# Patient Record
Sex: Female | Born: 1995 | Race: Black or African American | Hispanic: No | Marital: Single | Smoking: Former smoker
Health system: Southern US, Community
[De-identification: ages and names within clinical notes are randomized; demographics above are authoritative.]

## PROBLEM LIST (undated history)

## (undated) DIAGNOSIS — J45909 Unspecified asthma, uncomplicated: Secondary | ICD-10-CM

## (undated) HISTORY — PX: MOUTH SURGERY: SHX715

---
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
## 2014-11-17 ENCOUNTER — Emergency Department (HOSPITAL_COMMUNITY)
Admission: EM | Admit: 2014-11-17 | Discharge: 2014-11-17 | Disposition: A | Discharge status: Home / Self Care | Payer: Managed Care, Other (non HMO) | Attending: Emergency Medicine | Admitting: Emergency Medicine

## 2014-11-17 ENCOUNTER — Encounter (HOSPITAL_COMMUNITY): Payer: Self-pay | Admitting: Nurse Practitioner

## 2014-11-17 DIAGNOSIS — L500 Allergic urticaria: Secondary | ICD-10-CM | POA: Insufficient documentation

## 2014-11-17 DIAGNOSIS — Z87891 Personal history of nicotine dependence: Secondary | ICD-10-CM | POA: Insufficient documentation

## 2014-11-17 DIAGNOSIS — J45909 Unspecified asthma, uncomplicated: Secondary | ICD-10-CM | POA: Insufficient documentation

## 2014-11-17 DIAGNOSIS — Z79899 Other long term (current) drug therapy: Secondary | ICD-10-CM | POA: Diagnosis not present

## 2014-11-17 DIAGNOSIS — L509 Urticaria, unspecified: Secondary | ICD-10-CM

## 2014-11-17 DIAGNOSIS — R21 Rash and other nonspecific skin eruption: Secondary | ICD-10-CM | POA: Diagnosis present

## 2014-11-17 MED ORDER — PREDNISONE 20 MG PO TABS: ORAL_TABLET | ORAL | Status: DC

## 2014-11-17 MED ORDER — FAMOTIDINE 40 MG PO TABS: 40.0000 mg | ORAL_TABLET | Freq: Every day | ORAL | Status: DC

## 2014-11-17 MED ORDER — DIPHENHYDRAMINE HCL 25 MG PO TABS: 25.0000 mg | ORAL_TABLET | Freq: Four times a day (QID) | ORAL | Status: DC

## 2014-11-17 MED ORDER — FAMOTIDINE 20 MG PO TABS
40.0000 mg | ORAL_TABLET | Freq: Once | ORAL | Status: AC
  Administered 2014-11-17: 40 mg via ORAL
  Filled 2014-11-17: qty 2

## 2014-11-17 MED ORDER — DIPHENHYDRAMINE HCL 25 MG PO CAPS
50.0000 mg | ORAL_CAPSULE | Freq: Once | ORAL | Status: AC
  Administered 2014-11-17: 50 mg via ORAL
  Filled 2014-11-17: qty 2

## 2014-11-17 MED ORDER — DEXAMETHASONE SODIUM PHOSPHATE 10 MG/ML IJ SOLN
10.0000 mg | Freq: Once | INTRAMUSCULAR | Status: AC
  Administered 2014-11-17: 10 mg via INTRAMUSCULAR
  Filled 2014-11-17: qty 1

## 2014-11-17 NOTE — Discharge Instructions (Signed)
Take prednisone, benadryl and pepcid as prescribed.  Hives Hives are itchy, red, swollen areas of the skin. They can vary in size and location on your body. Hives can come and go for hours or several days (acute hives) or for several weeks (chronic hives). Hives do not spread from person to person (noncontagious). They may get worse with scratching, exercise, and emotional stress. CAUSES   Allergic reaction to food, additives, or drugs.  Infections, including the common cold.  Illness, such as vasculitis, lupus, or thyroid disease.  Exposure to sunlight, heat, or cold.  Exercise.  Stress.  Contact with chemicals. SYMPTOMS   Red or white swollen patches on the skin. The patches may change size, shape, and location quickly and repeatedly.  Itching.  Swelling of the hands, feet, and face. This may occur if hives develop deeper in the skin. DIAGNOSIS  Your caregiver can usually tell what is wrong by performing a physical exam. Skin or blood tests may also be done to determine the cause of your hives. In some cases, the cause cannot be determined. TREATMENT  Mild cases usually get better with medicines such as antihistamines. Severe cases may require an emergency epinephrine injection. If the cause of your hives is known, treatment includes avoiding that trigger.  HOME CARE INSTRUCTIONS   Avoid causes that trigger your hives.  Take antihistamines as directed by your caregiver to reduce the severity of your hives. Non-sedating or low-sedating antihistamines are usually recommended. Do not drive while taking an antihistamine.  Take any other medicines prescribed for itching as directed by your caregiver.  Wear loose-fitting clothing.  Keep all follow-up appointments as directed by your caregiver. SEEK MEDICAL CARE IF:   You have persistent or severe itching that is not relieved with medicine.  You have painful or swollen joints. SEEK IMMEDIATE MEDICAL CARE IF:   You have a  fever.  Your tongue or lips are swollen.  You have trouble breathing or swallowing.  You feel tightness in the throat or chest.  You have abdominal pain. These problems may be the first sign of a life-threatening allergic reaction. Call your local emergency services (911 in [place] MAKE SURE YOU:   Understand these instructions.  Will watch your condition.  Will get help right away if you are not doing well or get worse. Document Released: 08/06/2005 Document Revised: 08/11/2013 Document Reviewed: 10/30/2011 [facility] Patient Information 2015 [employer], [place]. This information is not intended to replace advice given to you by your health care provider. Make sure you discuss any questions you have with your health care provider.

## 2014-11-17 NOTE — ED Provider Notes (Signed)
CSN: [id number]     Arrival date & time 11/17/14  1900 History   This chart was scribed for non-physician practitioner Kathrynn Speed PA-C working with Gwyneth Sprout, MD by Conchita Paris, ED Scribe. This patient was seen in [facility] and the patient's care was started at 7:16 PM.   Chief Complaint  Patient presents with  . Allergic Reaction  . Rash   The history is provided by the patient. No language interpreter was used.    HPI Comments: Dana Travis is a 19 y.o. female who presents to the Emergency Department complaining of a generalized rash acute onset 15 min pta. The rash is located primarily on her back, but there are areas on her chest, abdomen and upper thighs. Pt states the rash is burning and itching. [place] her [place] came in contact with bushes. She has no known allergies. Pt denies SOB, and facial swelling.  Past Medical History  Diagnosis Date  . Asthma    Past Surgical History  Procedure Laterality Date  . Mouth surgery     History reviewed. No pertinent family history. History  Substance Use Topics  . Smoking status: Former Games developer  . Smokeless tobacco: Not on file  . Alcohol Use: Yes   OB History    No data available     Review of Systems  HENT: Negative for facial swelling.   Respiratory: Negative for shortness of breath.   Skin: Positive for rash.  All other systems reviewed and are negative.  Allergies  Alcohol-sulfur  Home Medications   Prior to Admission medications   Medication Sig Start Date End Date Taking? Authorizing Provider  buPROPion (WELLBUTRIN XL) 300 MG 24 hr tablet Take 300 mg by mouth daily.    Historical Provider, MD  diphenhydrAMINE (BENADRYL) 25 MG tablet Take 1 tablet (25 mg total) by mouth every 6 (six) hours. 11/17/14   Weston Kallman M Alley Neils, PA-C  Etonogestrel (NEXPLANON [place]) Inject into the skin.    Historical Provider, MD  famotidine (PEPCID) 40 MG tablet Take 1 tablet (40 mg total) by mouth daily. 11/17/14    Nicanor Mendolia M Kharee Lesesne, PA-C  ibuprofen (ADVIL,MOTRIN) 200 MG tablet Take 600 mg by mouth once.    Historical Provider, MD  Methylphenidate HCl (CONCERTA PO) Take 54 mg by mouth daily.    Historical Provider, MD  ondansetron (ZOFRAN ODT) 4 MG disintegrating tablet Take 1 tablet (4 mg total) by mouth every 8 (eight) hours as needed for nausea. 11/12/12   Garlon Hatchet, PA-C  predniSONE (DELTASONE) 20 MG tablet 2 tabs po daily x 4 days 11/17/14   Kathrynn Speed, PA-C  traZODone (DESYREL) 50 MG tablet Take 75 mg by mouth at bedtime.    Historical Provider, MD   BP 132/63 mmHg  Pulse 80  Temp(Src) 98.2 F (36.8 C) (Oral)  Resp 18  SpO2 100% Physical Exam  Constitutional: She is oriented to person, place, and time. She appears well-developed and well-nourished. No distress.  HENT:  Head: Normocephalic and atraumatic.  Mouth/Throat: Oropharynx is clear and moist.  No angioedema.  Eyes: Conjunctivae and EOM are normal.  Neck: Normal range of motion. Neck supple.  Cardiovascular: Normal rate, regular rhythm and normal heart sounds.   Pulmonary/Chest: Effort normal and breath sounds normal. No stridor. No respiratory distress.  Musculoskeletal: Normal range of motion. She exhibits no edema.  Neurological: She is alert and oriented to person, place, and time. No sensory deficit.  Skin: Skin  is warm and dry.  Diffuse urticaria on back, abdomen, inner thighs. Spares palms. No mucosal involvement.  Psychiatric: She has a normal mood and affect. Her behavior is normal.  Nursing note and vitals reviewed.   ED Course  Procedures  DIAGNOSTIC STUDIES: Oxygen Saturation is 100% on room air, normal by my interpretation.    COORDINATION OF CARE: 7:20 PM Discussed treatment plan with pt at bedside and pt agreed to plan. Will give decadron, pepcid and benadryl. No rest/airway compromise or involvement. No angioedema.  Labs Review Labs Reviewed - No data to display  Imaging Review No results found.   EKG  Interpretation None      MDM   Final diagnoses:  Hives    NAD. VSS. No respiratory or airway compromise. No angioedema. Decadron, pepcid and benadryl given in ED due to rapid spread of rash. Most likely from contact to woods. No further spread in ED. Stable for d/c. Rx prednisone, benadryl and pepcid. F/u with PCP. Return precautions given. Patient states understanding of treatment care plan and is agreeable.  I personally performed the services described in this documentation, which was scribed in my presence. The recorded information has been reviewed and is accurate.  Kathrynn SpeedRobyn M Liahna Brickner, PA-C 11/17/14 1958  Gwyneth SproutWhitney Plunkett, MD 11/18/14 [phone]

## 2014-11-17 NOTE — ED Notes (Signed)
Pt presents with generalized rash with more concentration on her back, states she went to the park and suspects pollen from a tree close by her packing may have triggered the reaction. C/o burning and itching sensation on areas with the rash. Denies any symptoms associated with anaphylaxis.

## 2015-10-29 ENCOUNTER — Encounter (HOSPITAL_COMMUNITY): Payer: Self-pay | Admitting: *Deleted

## 2015-10-29 ENCOUNTER — Emergency Department (HOSPITAL_COMMUNITY)
Admission: EM | Admit: 2015-10-29 | Discharge: 2015-10-30 | Disposition: A | Discharge status: Home / Self Care | Payer: Managed Care, Other (non HMO) | Attending: Emergency Medicine | Admitting: Emergency Medicine

## 2015-10-29 DIAGNOSIS — Z791 Long term (current) use of non-steroidal anti-inflammatories (NSAID): Secondary | ICD-10-CM | POA: Diagnosis not present

## 2015-10-29 DIAGNOSIS — J3489 Other specified disorders of nose and nasal sinuses: Secondary | ICD-10-CM | POA: Insufficient documentation

## 2015-10-29 DIAGNOSIS — R05 Cough: Secondary | ICD-10-CM | POA: Diagnosis not present

## 2015-10-29 DIAGNOSIS — Z7952 Long term (current) use of systemic steroids: Secondary | ICD-10-CM | POA: Insufficient documentation

## 2015-10-29 DIAGNOSIS — Z79899 Other long term (current) drug therapy: Secondary | ICD-10-CM | POA: Insufficient documentation

## 2015-10-29 DIAGNOSIS — J45909 Unspecified asthma, uncomplicated: Secondary | ICD-10-CM | POA: Diagnosis not present

## 2015-10-29 DIAGNOSIS — M791 Myalgia: Secondary | ICD-10-CM | POA: Insufficient documentation

## 2015-10-29 DIAGNOSIS — Z87891 Personal history of nicotine dependence: Secondary | ICD-10-CM | POA: Diagnosis not present

## 2015-10-29 DIAGNOSIS — R509 Fever, unspecified: Secondary | ICD-10-CM | POA: Insufficient documentation

## 2015-10-29 DIAGNOSIS — Z793 Long term (current) use of hormonal contraceptives: Secondary | ICD-10-CM | POA: Insufficient documentation

## 2015-10-29 DIAGNOSIS — R112 Nausea with vomiting, unspecified: Secondary | ICD-10-CM | POA: Diagnosis present

## 2015-10-29 LAB — URINALYSIS, ROUTINE W REFLEX MICROSCOPIC
Glucose, UA: NEGATIVE mg/dL
HGB URINE DIPSTICK: NEGATIVE
Ketones, ur: >80 mg/dL — AB
LEUKOCYTES UA: NEGATIVE
NITRITE: NEGATIVE
PROTEIN: 100 mg/dL — AB
SPECIFIC GRAVITY, URINE: 1.031 — AB (ref 1.005–1.030)
pH: 6 (ref 5.0–8.0)

## 2015-10-29 LAB — COMPREHENSIVE METABOLIC PANEL
ALK PHOS: 57 U/L (ref 38–126)
ALT: 18 U/L (ref 14–54)
ANION GAP: 13 (ref 5–15)
AST: 31 U/L (ref 15–41)
Albumin: 4.8 g/dL (ref 3.5–5.0)
BUN: 9 mg/dL (ref 6–20)
CALCIUM: 9.7 mg/dL (ref 8.9–10.3)
CHLORIDE: 100 mmol/L — AB (ref 101–111)
CO2: 22 mmol/L (ref 22–32)
CREATININE: 1.03 mg/dL — AB (ref 0.44–1.00)
Glucose, Bld: 93 mg/dL (ref 65–99)
Potassium: 3.7 mmol/L (ref 3.5–5.1)
SODIUM: 135 mmol/L (ref 135–145)
Total Bilirubin: 0.5 mg/dL (ref 0.3–1.2)
Total Protein: 8.3 g/dL — ABNORMAL HIGH (ref 6.5–8.1)

## 2015-10-29 LAB — CBC
HCT: 45.1 % (ref 36.0–46.0)
HEMOGLOBIN: 14.9 g/dL (ref 12.0–15.0)
MCH: 28.8 pg (ref 26.0–34.0)
MCHC: 33 g/dL (ref 30.0–36.0)
MCV: 87.2 fL (ref 78.0–100.0)
PLATELETS: 372 10*3/uL (ref 150–400)
RBC: 5.17 MIL/uL — AB (ref 3.87–5.11)
RDW: 13.3 % (ref 11.5–15.5)
WBC: 7.1 10*3/uL (ref 4.0–10.5)

## 2015-10-29 LAB — URINE MICROSCOPIC-ADD ON

## 2015-10-29 LAB — LIPASE, BLOOD: LIPASE: 16 U/L (ref 11–51)

## 2015-10-29 LAB — POC URINE PREG, ED: PREG TEST UR: NEGATIVE

## 2015-10-29 MED ORDER — ONDANSETRON 4 MG PO TBDP
4.0000 mg | ORAL_TABLET | Freq: Once | ORAL | Status: AC | PRN
  Administered 2015-10-29: 4 mg via ORAL
  Filled 2015-10-29: qty 1

## 2015-10-29 MED ORDER — ONDANSETRON HCL 4 MG/2ML IJ SOLN
4.0000 mg | Freq: Once | INTRAMUSCULAR | Status: AC
  Administered 2015-10-29: 4 mg via INTRAVENOUS
  Filled 2015-10-29: qty 2

## 2015-10-29 MED ORDER — METOCLOPRAMIDE HCL 5 MG/ML IJ SOLN
5.0000 mg | Freq: Once | INTRAMUSCULAR | Status: AC
  Administered 2015-10-29: 5 mg via INTRAVENOUS
  Filled 2015-10-29: qty 2

## 2015-10-29 MED ORDER — SODIUM CHLORIDE 0.9 % IV BOLUS (SEPSIS)
1000.0000 mL | Freq: Once | INTRAVENOUS | Status: AC
  Administered 2015-10-29: 1000 mL via INTRAVENOUS

## 2015-10-29 NOTE — ED Provider Notes (Signed)
CSN: [id number]     Arrival date & time 10/29/15  1551 History   First MD Initiated Contact with Patient 10/29/15 1601     Chief Complaint  Patient presents with  . Emesis  . Abdominal Pain      HPI 20 year old female presenting with flulike symptoms for the last 3 days. Patient reports that she was seen by her PCP 3 days ago and prescribed Tamiflu. After taking the Tamiflu, however, she began to have increased vomiting. Taking the medication. Symptoms include generalized myalgias, subjective fevers, clear rhinorrhea, dry cough, nausea, and vomiting. She denies chest pain or shortness of breath. No dysuria or abdominal pain. She reports that she has been unable to tolerate by mouth intake other than small amounts of fluid for the last 24 hours. Vomiting is nonbloody nonbilious. Diarrhea or constipation. She does endorse some small streaks of blood in her vomit after vomiting multiple times but denies any chest pain. Past Medical History  Diagnosis Date  . Asthma    Past Surgical History  Procedure Laterality Date  . Mouth surgery     History reviewed. No pertinent family history. Social History  Substance Use Topics  . Smoking status: Former Games developermoker  . Smokeless tobacco: None  . Alcohol Use: Yes   OB History    No data available     Review of Systems  Constitutional: Positive for fever and chills. Negative for activity change and appetite change.  HENT: Negative for congestion, rhinorrhea and sore throat.   Eyes: Negative for visual disturbance.  Respiratory: Positive for cough. Negative for shortness of breath and wheezing.   Cardiovascular: Negative for chest pain and palpitations.  Gastrointestinal: Positive for nausea and vomiting. Negative for abdominal pain, diarrhea, constipation, blood in stool and abdominal distention.  Genitourinary: Negative for dysuria, frequency and flank pain.  Musculoskeletal: Positive for myalgias. Negative for back pain, joint swelling,  arthralgias, gait problem, neck pain and neck stiffness.  Skin: Negative for rash.  Neurological: Negative for dizziness, tremors, syncope, facial asymmetry, speech difficulty, weakness, numbness and headaches.  Psychiatric/Behavioral: Negative for behavioral problems, confusion and agitation.      Allergies  Alcohol-sulfur  Home Medications   Prior to Admission medications   Medication Sig Start Date End Date Taking? Authorizing Provider  buPROPion (WELLBUTRIN XL) 300 MG 24 hr tablet Take 300 mg by mouth daily.    Historical Provider, MD  diphenhydrAMINE (BENADRYL) 25 MG tablet Take 1 tablet (25 mg total) by mouth every 6 (six) hours. 11/17/14   Robyn M Hess, PA-C  Etonogestrel (NEXPLANON [place]) Inject into the skin.    Historical Provider, MD  famotidine (PEPCID) 40 MG tablet Take 1 tablet (40 mg total) by mouth daily. 11/17/14   Robyn M Hess, PA-C  ibuprofen (ADVIL,MOTRIN) 200 MG tablet Take 600 mg by mouth once.    Historical Provider, MD  Methylphenidate HCl (CONCERTA PO) Take 54 mg by mouth daily.    Historical Provider, MD  ondansetron (ZOFRAN ODT) 4 MG disintegrating tablet Take 1 tablet (4 mg total) by mouth every 8 (eight) hours as needed for nausea. 11/12/12   Garlon HatchetLisa M Sanders, PA-C  predniSONE (DELTASONE) 20 MG tablet 2 tabs po daily x 4 days 11/17/14   Kathrynn Speedobyn M Hess, PA-C  traZODone (DESYREL) 50 MG tablet Take 75 mg by mouth at bedtime.    Historical Provider, MD   BP 118/94 mmHg  Pulse 81  Temp(Src) 98.8 F (37.1 C) (Oral)  Resp 16  Ht 5' 3.5" (  1.613 m)  Wt 79.918 kg  BMI 30.72 kg/m2  SpO2 100%  LMP 10/15/2015 Physical Exam  Constitutional: She is oriented to person, place, and time. She appears well-developed and well-nourished. No distress.  HENT:  Head: Normocephalic and atraumatic.  Right Ear: External ear normal.  Left Ear: External ear normal.  Nose: Nose normal.  Mouth/Throat: No oropharyngeal exudate.  Dry mucous membranes  Eyes: Conjunctivae and EOM are  normal. Pupils are equal, round, and reactive to light. Right eye exhibits no discharge. Left eye exhibits no discharge.  Neck: Normal range of motion. Neck supple.  Cardiovascular: Normal rate, regular rhythm and normal heart sounds.  Exam reveals no gallop and no friction rub.   No murmur heard. Pulmonary/Chest: Effort normal and breath sounds normal. No respiratory distress. She has no wheezes. She has no rales.  Abdominal: Soft. Bowel sounds are normal. She exhibits no distension and no mass. There is no tenderness. There is no rebound and no guarding.  Musculoskeletal: Normal range of motion. She exhibits no edema or tenderness.  Neurological: She is alert and oriented to person, place, and time. She exhibits normal muscle tone.  Skin: Skin is warm and dry. No rash noted. She is not diaphoretic.  Psychiatric: She has a normal mood and affect. Her behavior is normal. Judgment and thought content normal.    ED Course  Procedures (including critical care time) Labs Review Labs Reviewed  COMPREHENSIVE METABOLIC PANEL - Abnormal; Notable for the following:    Chloride 100 (*)    Creatinine, Ser 1.03 (*)    Total Protein 8.3 (*)    All other components within normal limits  CBC - Abnormal; Notable for the following:    RBC 5.17 (*)    All other components within normal limits  LIPASE, BLOOD  URINALYSIS, ROUTINE W REFLEX MICROSCOPIC (NOT AT [facility])  POC URINE PREG, ED    Imaging Review No results found. I have personally reviewed and evaluated these images and lab results as part of my medical decision-making.   EKG Interpretation None      MDM   Final diagnoses:  None    On arrival to the patient is afebrile and he medically stable. Mucous membranes dry and suspect element of dehydration. She endorses some small streaks of blood after vomiting forcefully however has no chest pain and no crepitus. No evidence of esophageal perforation. Lungs CTAP patient reports that her cough  is dry and I doubt pneumonia at this time. Patient's current constellation of symptoms is consistent with influenza or another viral-like process. No leukocytosis and I doubt a severe bacterial infection. Labs are significant for creatinine elevated to 1.03 which is likely in the setting of dehydration from recent vomiting. No evidence of a UTI patient denies dysuria or frequency. Patient will be given Zofran and IV fluids and then reassessed.  On reassessment patient reports symptomatically improvement of her nausea after Zofran, Reglan, and IV fluids. Plan to discharge him with small amount of Reglan as this seemed to help most with her nausea, and follow-up with her PCP early next week should symptoms continue. Additionally she was given strict precautions to return to the ED for inability to tolerate fluids by mouth.    Toua Stites Ernestina Penna, MD 10/30/15 0006  Lavera Guise, MD 10/30/15 [phone]

## 2015-10-29 NOTE — ED Notes (Signed)
Pt reports flu like SX's since WED. Pt also reports vomiting 8 times in past 24 HR with blood in fluid.

## 2015-10-30 ENCOUNTER — Emergency Department (HOSPITAL_COMMUNITY): Payer: Managed Care, Other (non HMO)

## 2015-10-30 ENCOUNTER — Emergency Department (HOSPITAL_COMMUNITY)
Admission: EM | Admit: 2015-10-30 | Discharge: 2015-10-30 | Disposition: A | Discharge status: Home / Self Care | Payer: Managed Care, Other (non HMO) | Source: Home / Self Care | Attending: Emergency Medicine | Admitting: Emergency Medicine

## 2015-10-30 ENCOUNTER — Encounter (HOSPITAL_COMMUNITY): Payer: Self-pay | Admitting: *Deleted

## 2015-10-30 DIAGNOSIS — F1210 Cannabis abuse, uncomplicated: Secondary | ICD-10-CM | POA: Insufficient documentation

## 2015-10-30 DIAGNOSIS — J45901 Unspecified asthma with (acute) exacerbation: Secondary | ICD-10-CM

## 2015-10-30 DIAGNOSIS — Z79899 Other long term (current) drug therapy: Secondary | ICD-10-CM | POA: Insufficient documentation

## 2015-10-30 DIAGNOSIS — Z87891 Personal history of nicotine dependence: Secondary | ICD-10-CM | POA: Insufficient documentation

## 2015-10-30 DIAGNOSIS — Z3202 Encounter for pregnancy test, result negative: Secondary | ICD-10-CM | POA: Insufficient documentation

## 2015-10-30 DIAGNOSIS — R05 Cough: Secondary | ICD-10-CM | POA: Insufficient documentation

## 2015-10-30 DIAGNOSIS — R112 Nausea with vomiting, unspecified: Secondary | ICD-10-CM | POA: Insufficient documentation

## 2015-10-30 LAB — URINALYSIS, ROUTINE W REFLEX MICROSCOPIC
Glucose, UA: NEGATIVE mg/dL
LEUKOCYTES UA: NEGATIVE
NITRITE: NEGATIVE
PH: 6 (ref 5.0–8.0)
Protein, ur: 30 mg/dL — AB
SPECIFIC GRAVITY, URINE: 1.034 — AB (ref 1.005–1.030)

## 2015-10-30 LAB — BASIC METABOLIC PANEL
ANION GAP: 9 (ref 5–15)
BUN: 14 mg/dL (ref 6–20)
CHLORIDE: 108 mmol/L (ref 101–111)
CO2: 24 mmol/L (ref 22–32)
Calcium: 8.8 mg/dL — ABNORMAL LOW (ref 8.9–10.3)
Creatinine, Ser: 0.83 mg/dL (ref 0.44–1.00)
GFR calc Af Amer: >60 mL/min (ref >60)
Glucose, Bld: 92 mg/dL (ref 65–99)
POTASSIUM: 3.7 mmol/L (ref 3.5–5.1)
Sodium: 141 mmol/L (ref 135–145)

## 2015-10-30 LAB — CBC WITH DIFFERENTIAL/PLATELET
BASOS ABS: 0 10*3/uL (ref 0.0–0.1)
Basophils Relative: 0 %
Eosinophils Absolute: 0 10*3/uL (ref 0.0–0.7)
Eosinophils Relative: 0 %
HEMATOCRIT: 40.6 % (ref 36.0–46.0)
HEMOGLOBIN: 14 g/dL (ref 12.0–15.0)
LYMPHS ABS: 1.6 10*3/uL (ref 0.7–4.0)
LYMPHS PCT: 13 %
MCH: 29.4 pg (ref 26.0–34.0)
MCHC: 34.5 g/dL (ref 30.0–36.0)
MCV: 85.3 fL (ref 78.0–100.0)
Monocytes Absolute: 0.8 10*3/uL (ref 0.1–1.0)
Monocytes Relative: 6 %
NEUTROS ABS: 10.6 10*3/uL — AB (ref 1.7–7.7)
Neutrophils Relative %: 81 %
Platelets: 342 10*3/uL (ref 150–400)
RBC: 4.76 MIL/uL (ref 3.87–5.11)
RDW: 13.4 % (ref 11.5–15.5)
WBC: 13 10*3/uL — AB (ref 4.0–10.5)

## 2015-10-30 LAB — URINE MICROSCOPIC-ADD ON: WBC, UA: NONE SEEN WBC/hpf (ref 0–5)

## 2015-10-30 LAB — RAPID URINE DRUG SCREEN, HOSP PERFORMED
AMPHETAMINES: NOT DETECTED
BARBITURATES: NOT DETECTED
BENZODIAZEPINES: NOT DETECTED
COCAINE: NOT DETECTED
Opiates: NOT DETECTED
TETRAHYDROCANNABINOL: POSITIVE — AB

## 2015-10-30 LAB — PREGNANCY, URINE: Preg Test, Ur: NEGATIVE

## 2015-10-30 MED ORDER — ONDANSETRON HCL 4 MG/2ML IJ SOLN
4.0000 mg | Freq: Once | INTRAMUSCULAR | Status: AC
  Administered 2015-10-30: 4 mg via INTRAVENOUS
  Filled 2015-10-30: qty 2

## 2015-10-30 MED ORDER — SODIUM CHLORIDE 0.9 % IV BOLUS (SEPSIS)
1000.0000 mL | Freq: Once | INTRAVENOUS | Status: AC
  Administered 2015-10-30: 1000 mL via INTRAVENOUS

## 2015-10-30 MED ORDER — PROMETHAZINE HCL 25 MG/ML IJ SOLN
25.0000 mg | Freq: Once | INTRAMUSCULAR | Status: AC
  Administered 2015-10-30: 25 mg via INTRAVENOUS
  Filled 2015-10-30: qty 1

## 2015-10-30 MED ORDER — METOCLOPRAMIDE HCL 10 MG PO TABS: 10.0000 mg | ORAL_TABLET | Freq: Three times a day (TID) | ORAL | Status: AC | PRN

## 2015-10-30 MED ORDER — PROMETHAZINE HCL 25 MG PO TABS: 25.0000 mg | ORAL_TABLET | Freq: Four times a day (QID) | ORAL | Status: DC | PRN

## 2015-10-30 NOTE — ED Provider Notes (Signed)
CSN: [id number]     Arrival date & time 10/30/15  1319 History   First MD Initiated Contact with Patient 10/30/15 1335     Chief Complaint  Patient presents with  . Emesis     (Consider location/radiation/quality/duration/timing/severity/associated sxs/prior Treatment) HPI Dana Travis is a 20 y.o. female with a history of asthma comes in for evaluation of nausea and vomiting. Patient reports she was seen at [facility] emergency Department yesterday and diagnosed with flulike illness. She reports ongoing nausea and vomiting with multiple episodes of nonbloody and nonbilious emesis since Thursday. She reports feeling better at the end of her emergency department visit yesterday, but when she woke up this morning, she began throwing up again. Discharged home with Reglan, that has not been effective. Also reports intermittent dry cough. Denies fevers, chest pain, shortness of breath, overt abdominal pain, illicit drug use (specifically marijuana), urinary symptoms.  Past Medical History  Diagnosis Date  . Asthma    Past Surgical History  Procedure Laterality Date  . Mouth surgery     No family history on file. Social History  Substance Use Topics  . Smoking status: Former Games developer  . Smokeless tobacco: None  . Alcohol Use: Yes   OB History    No data available     Review of Systems A 10 point review of systems was completed and was negative except for pertinent positives and negatives as mentioned in the history of present illness     Allergies  Alcohol-sulfur  Home Medications   Prior to Admission medications   Medication Sig Start Date End Date Taking? Authorizing Provider  albuterol (PROVENTIL HFA;VENTOLIN HFA) 108 (90 Base) MCG/ACT inhaler Inhale 1 puff into the lungs every 6 (six) hours as needed for wheezing or shortness of breath.   Yes Historical Provider, MD  amphetamine-dextroamphetamine (ADDERALL) 10 MG tablet Take 10 mg by mouth 2 (two) times daily as needed (for  attention disorder).  08/03/15  Yes Historical Provider, MD  Etonogestrel (NEXPLANON Redbird Smith) Inject into the skin.   Yes Historical Provider, MD  metoCLOPramide (REGLAN) 10 MG tablet Take 1 tablet (10 mg total) by mouth every 8 (eight) hours as needed for nausea or vomiting. 10/30/15  Yes Jenifer Ernestina Penna, MD  traZODone (DESYREL) 50 MG tablet Take 75 mg by mouth at bedtime.   Yes Historical Provider, MD  LORazepam (ATIVAN) 0.5 MG tablet Take 1 tablet by mouth 4 (four) times daily - after meals and at bedtime.  08/08/15   Historical Provider, MD  promethazine (PHENERGAN) 25 MG tablet Take 1 tablet (25 mg total) by mouth every 6 (six) hours as needed for nausea or vomiting. 10/30/15   Joycie Peek, PA-C   BP 118/90 mmHg  Pulse 79  Temp(Src) 99.2 F (37.3 C)  Resp 18  SpO2 95%  LMP 10/15/2015 Physical Exam  Constitutional: She is oriented to person, place, and time. She appears well-developed and well-nourished.  HENT:  Head: Normocephalic and atraumatic.  Mouth/Throat: Oropharynx is clear and moist.  Eyes: Conjunctivae are normal. Pupils are equal, round, and reactive to light. Right eye exhibits no discharge. Left eye exhibits no discharge. No scleral icterus.  Neck: Neck supple.  Cardiovascular: Normal rate, regular rhythm and normal heart sounds.   Pulmonary/Chest: Effort normal. No respiratory distress.  Mild, diffuse wheezing in all fields. Possible basilar rhonchi. Otherwise no other adventitious lung sounds. No respiratory distress. Maintains 99% oxygenation on room air.  Abdominal: Soft. There is no tenderness.  Musculoskeletal: She exhibits  no tenderness.  Neurological: She is alert and oriented to person, place, and time.  Cranial Nerves II-XII grossly intact  Skin: Skin is warm and dry. No rash noted.  Psychiatric: She has a normal mood and affect.  Nursing note and vitals reviewed.   ED Course  Procedures (including critical care time) Labs Review Labs Reviewed  BASIC  METABOLIC PANEL - Abnormal; Notable for the following:    Calcium 8.8 (*)    All other components within normal limits  CBC WITH DIFFERENTIAL/PLATELET - Abnormal; Notable for the following:    WBC 13.0 (*)    Neutro Abs 10.6 (*)    All other components within normal limits  URINALYSIS, ROUTINE W REFLEX MICROSCOPIC (NOT AT [facility]) - Abnormal; Notable for the following:    Color, Urine AMBER (*)    APPearance CLOUDY (*)    Specific Gravity, Urine 1.034 (*)    Hgb urine dipstick LARGE (*)    Bilirubin Urine SMALL (*)    Ketones, ur >80 (*)    Protein, ur 30 (*)    All other components within normal limits  URINE RAPID DRUG SCREEN, HOSP PERFORMED - Abnormal; Notable for the following:    Tetrahydrocannabinol POSITIVE (*)    All other components within normal limits  URINE MICROSCOPIC-ADD ON - Abnormal; Notable for the following:    Squamous Epithelial / LPF 0-5 (*)    Bacteria, UA FEW (*)    All other components within normal limits  PREGNANCY, URINE    Imaging Review Dg Chest 2 View  10/30/2015  CLINICAL DATA:  The tip fever and emesis.  Cough. EXAM: CHEST - 2 VIEW COMPARISON:  Two-view chest x-ray 11/12/2012. FINDINGS: The heart size and mediastinal contours are within normal limits. Both lungs are clear. The visualized skeletal structures are unremarkable. IMPRESSION: Negative two view chest x-ray Electronically Signed   By: Marin Robertshristopher  Mattern M.D.   On: 10/30/2015 14:45   I have personally reviewed and evaluated these images and lab results as part of my medical decision-making.   EKG Interpretation None      Meds given in ED:  Medications  sodium chloride 0.9 % bolus 1,000 mL (0 mLs Intravenous Stopped 10/30/15 1552)  ondansetron (ZOFRAN) injection 4 mg (4 mg Intravenous Given 10/30/15 1410)  sodium chloride 0.9 % bolus 1,000 mL (0 mLs Intravenous Stopped 10/30/15 1656)  promethazine (PHENERGAN) injection 25 mg (25 mg Intravenous Given 10/30/15 1548)    New Prescriptions    PROMETHAZINE (PHENERGAN) 25 MG TABLET    Take 1 tablet (25 mg total) by mouth every 6 (six) hours as needed for nausea or vomiting.   Filed Vitals:   10/30/15 1330 10/30/15 1553 10/30/15 1646  BP: 122/65 112/85 118/90  Pulse: 118 74 79  Temp: 99.2 F (37.3 C)    Resp: 20 18 18   SpO2: 99% 99% 95%    3:14pm--upon recheck, patient appears in no apparent distress, playing on phone. However, states still vomiting and unable to tolerate ginger ale. Will try Phenergan, 25 mg IV. MDM  Aggie HackerLeigh Rahming is a 20 y.o. female with recent diagnosis of flu comes in for evaluation of recurrent emesis. On arrival, hemodynamically stable, afebrile. Original tachycardia likely secondary to activity versus agitation as it quickly resolves with rest. Physical exam is unremarkable, moist mucous membranes, benign abdominal exam, no evidence of overt dehydration. Plan to obtain screening labs, urinalysis, urine rapid drug screen. Patient has one episode of emesis in the ED with Zofran. Screening labs  are unremarkable,  Pregnancy negative, urinalysis shows increased specific gravity 1.034, increased ketones, but no evidence of infection., Although patient specifically denies marijuana use, urine rapid drug screen is positive for THC. 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 Given Phenergan in the emergency department with resolution of emesis, she is tolerating oral fluids. Discharged with short course Phenergan and instructions to stay well-hydrated with aggressive oral rehydration. Follow up with PCP next week. She understands return to emergency department for any new, worsening or other concerning symptoms. Appropriate for discharge. Final diagnoses:  Non-intractable vomiting with nausea, vomiting of unspecified type        Joycie Peek, PA-C 11/01/15 0809  Laurence Spates, MD 11/01/15 2256

## 2015-10-30 NOTE — Discharge Instructions (Signed)

## 2015-10-30 NOTE — ED Notes (Signed)
Per EMS, pt from school.  [facility] seen [facility] yesterday for [facility].  Has been going since Thursday.  Pt has vomited x 5 today.  Pt states told yesterday if her script for reglan did not work, to come back.  IV: 22g Rt hand, 400 cc NS in route.  4 mg zofran IV.  No abdominal pain.  Vitals:  128/80, hr 90, resp 16, cbg 86

## 2015-10-30 NOTE — Discharge Instructions (Signed)
Your evaluation in the emergency department does not show any emergent causes for your symptoms at this time. Ear exam and labs are all reassuring. Please take your medications as prescribed. It is important to stay well hydrated and drink plenty of water, Gatorade or other electrolyte balance solution. Follow-up with your doctor next week for reevaluation. Return to ED for new or worsening symptoms as we discussed.  Nausea and Vomiting Nausea is a sick feeling that often comes before throwing up (vomiting). Vomiting is a reflex where stomach contents come out of your mouth. Vomiting can cause severe loss of body fluids (dehydration). Children and elderly adults can become dehydrated quickly, especially if they also have diarrhea. Nausea and vomiting are symptoms of a condition or disease. It is important to find the cause of your symptoms. CAUSES   Direct irritation of the stomach lining. This irritation can result from increased acid production (gastroesophageal reflux disease), infection, food poisoning, taking certain medicines (such as nonsteroidal anti-inflammatory drugs), alcohol use, or tobacco use.  Signals from the brain.These signals could be caused by a headache, heat exposure, an inner ear disturbance, increased pressure in the brain from injury, infection, a tumor, or a concussion, pain, emotional stimulus, or metabolic problems.  An obstruction in the gastrointestinal tract (bowel obstruction).  Illnesses such as diabetes, hepatitis, gallbladder problems, appendicitis, kidney problems, cancer, sepsis, atypical symptoms of a heart attack, or eating disorders.  Medical treatments such as chemotherapy and radiation.  Receiving medicine that makes you sleep (general anesthetic) during surgery. DIAGNOSIS Your caregiver may ask for tests to be done if the problems do not improve after a few days. Tests may also be done if symptoms are severe or if the reason for the nausea and vomiting is  not clear. Tests may include:  Urine tests.  Blood tests.  Stool tests.  Cultures (to look for evidence of infection).  X-rays or other imaging studies. Test results can help your caregiver make decisions about treatment or the need for additional tests. TREATMENT You need to stay well hydrated. Drink frequently but in small amounts.You may wish to drink water, sports drinks, clear broth, or eat frozen ice pops or gelatin dessert to help stay hydrated.When you eat, eating slowly may help prevent nausea.There are also some antinausea medicines that may help prevent nausea. HOME CARE INSTRUCTIONS   Take all medicine as directed by your caregiver.  If you do not have an appetite, do not force yourself to eat. However, you must continue to drink fluids.  If you have an appetite, eat a normal diet unless your caregiver tells you differently.  Eat a variety of complex carbohydrates (rice, wheat, potatoes, bread), lean meats, yogurt, fruits, and vegetables.  Avoid high-fat foods because they are more difficult to digest.  Drink enough water and fluids to keep your urine clear or pale yellow.  If you are dehydrated, ask your caregiver for specific rehydration instructions. Signs of dehydration may include:  Severe thirst.  Dry lips and mouth.  Dizziness.  Dark urine.  Decreasing urine frequency and amount.  Confusion.  Rapid breathing or pulse. SEEK IMMEDIATE MEDICAL CARE IF:   You have blood or brown flecks (like coffee grounds) in your vomit.  You have black or bloody stools.  You have a severe headache or stiff neck.  You are confused.  You have severe abdominal pain.  You have chest pain or trouble breathing.  You do not urinate at least once every 8 hours.  You develop  cold or clammy skin.  You continue to vomit for longer than 24 to 48 hours.  You have a fever. MAKE SURE YOU:   Understand these instructions.  Will watch your condition.  Will get  help right away if you are not doing well or get worse.   This information is not intended to replace advice given to you by your health care provider. Make sure you discuss any questions you have with your health care provider.   Document Released: 08/06/2005 Document Revised: 10/29/2011 Document Reviewed: 01/03/2011 Elsevier Interactive Patient Education [employer]

## 2015-10-30 NOTE — ED Notes (Signed)
Pt given ginger ale.

## 2015-10-30 NOTE — ED Notes (Signed)
Bed: WA03 Expected date:  Expected time:  Means of arrival:  Comments: EMS- 20 yo N/V, [facility]

## 2015-11-01 ENCOUNTER — Emergency Department (HOSPITAL_COMMUNITY): Payer: Managed Care, Other (non HMO)

## 2015-11-01 ENCOUNTER — Encounter (HOSPITAL_COMMUNITY): Payer: Self-pay

## 2015-11-01 ENCOUNTER — Inpatient Hospital Stay (HOSPITAL_COMMUNITY)
Admission: EM | Admit: 2015-11-01 | Discharge: 2015-11-04 | DRG: 202 | Disposition: A | Discharge status: Home / Self Care | Payer: Managed Care, Other (non HMO) | Attending: Internal Medicine | Admitting: Internal Medicine

## 2015-11-01 DIAGNOSIS — R740 Nonspecific elevation of levels of transaminase and lactic acid dehydrogenase [LDH]: Secondary | ICD-10-CM | POA: Diagnosis present

## 2015-11-01 DIAGNOSIS — J9601 Acute respiratory failure with hypoxia: Secondary | ICD-10-CM | POA: Diagnosis not present

## 2015-11-01 DIAGNOSIS — G43A0 Cyclical vomiting, not intractable: Secondary | ICD-10-CM | POA: Diagnosis present

## 2015-11-01 DIAGNOSIS — R109 Unspecified abdominal pain: Secondary | ICD-10-CM

## 2015-11-01 DIAGNOSIS — R1110 Vomiting, unspecified: Secondary | ICD-10-CM

## 2015-11-01 DIAGNOSIS — J45901 Unspecified asthma with (acute) exacerbation: Secondary | ICD-10-CM | POA: Diagnosis present

## 2015-11-01 DIAGNOSIS — E876 Hypokalemia: Secondary | ICD-10-CM | POA: Diagnosis present

## 2015-11-01 DIAGNOSIS — R112 Nausea with vomiting, unspecified: Secondary | ICD-10-CM | POA: Diagnosis present

## 2015-11-01 DIAGNOSIS — R197 Diarrhea, unspecified: Secondary | ICD-10-CM

## 2015-11-01 DIAGNOSIS — Z79899 Other long term (current) drug therapy: Secondary | ICD-10-CM

## 2015-11-01 DIAGNOSIS — A084 Viral intestinal infection, unspecified: Secondary | ICD-10-CM | POA: Diagnosis present

## 2015-11-01 DIAGNOSIS — R1011 Right upper quadrant pain: Secondary | ICD-10-CM | POA: Diagnosis not present

## 2015-11-01 DIAGNOSIS — J441 Chronic obstructive pulmonary disease with (acute) exacerbation: Secondary | ICD-10-CM | POA: Diagnosis present

## 2015-11-01 DIAGNOSIS — F1290 Cannabis use, unspecified, uncomplicated: Secondary | ICD-10-CM | POA: Diagnosis present

## 2015-11-01 DIAGNOSIS — R062 Wheezing: Secondary | ICD-10-CM

## 2015-11-01 DIAGNOSIS — T407X5A Adverse effect of cannabis (derivatives), initial encounter: Secondary | ICD-10-CM | POA: Diagnosis present

## 2015-11-01 DIAGNOSIS — R932 Abnormal findings on diagnostic imaging of liver and biliary tract: Secondary | ICD-10-CM | POA: Diagnosis not present

## 2015-11-01 DIAGNOSIS — Z87891 Personal history of nicotine dependence: Secondary | ICD-10-CM

## 2015-11-01 DIAGNOSIS — R7401 Elevation of levels of liver transaminase levels: Secondary | ICD-10-CM

## 2015-11-01 LAB — RAPID URINE DRUG SCREEN, HOSP PERFORMED
AMPHETAMINES: NOT DETECTED
BENZODIAZEPINES: NOT DETECTED
Barbiturates: NOT DETECTED
COCAINE: NOT DETECTED
Opiates: NOT DETECTED
Tetrahydrocannabinol: POSITIVE — AB

## 2015-11-01 LAB — COMPREHENSIVE METABOLIC PANEL
ALBUMIN: 4.2 g/dL (ref 3.5–5.0)
ALT: 32 U/L (ref 14–54)
ANION GAP: 13 (ref 5–15)
AST: 35 U/L (ref 15–41)
Alkaline Phosphatase: 49 U/L (ref 38–126)
BILIRUBIN TOTAL: 1.1 mg/dL (ref 0.3–1.2)
BUN: 7 mg/dL (ref 6–20)
CO2: 23 mmol/L (ref 22–32)
Calcium: 9.5 mg/dL (ref 8.9–10.3)
Chloride: 107 mmol/L (ref 101–111)
Creatinine, Ser: 0.69 mg/dL (ref 0.44–1.00)
GFR calc Af Amer: >60 mL/min (ref >60)
GFR calc non Af Amer: >60 mL/min (ref >60)
GLUCOSE: 91 mg/dL (ref 65–99)
POTASSIUM: 3.4 mmol/L — AB (ref 3.5–5.1)
SODIUM: 143 mmol/L (ref 135–145)
TOTAL PROTEIN: 7.4 g/dL (ref 6.5–8.1)

## 2015-11-01 LAB — CBC
HEMATOCRIT: 42.5 % (ref 36.0–46.0)
HEMOGLOBIN: 14 g/dL (ref 12.0–15.0)
MCH: 29.4 pg (ref 26.0–34.0)
MCHC: 32.9 g/dL (ref 30.0–36.0)
MCV: 89.1 fL (ref 78.0–100.0)
Platelets: 205 10*3/uL (ref 150–400)
RBC: 4.77 MIL/uL (ref 3.87–5.11)
RDW: 13.1 % (ref 11.5–15.5)
WBC: 9.1 10*3/uL (ref 4.0–10.5)

## 2015-11-01 LAB — URINALYSIS, ROUTINE W REFLEX MICROSCOPIC
BILIRUBIN URINE: NEGATIVE
GLUCOSE, UA: NEGATIVE mg/dL
HGB URINE DIPSTICK: NEGATIVE
Leukocytes, UA: NEGATIVE
NITRITE: NEGATIVE
PH: 7 (ref 5.0–8.0)
Protein, ur: NEGATIVE mg/dL
SPECIFIC GRAVITY, URINE: 1.008 (ref 1.005–1.030)

## 2015-11-01 LAB — I-STAT BETA HCG BLOOD, ED (MC, WL, AP ONLY)

## 2015-11-01 LAB — LIPASE, BLOOD: LIPASE: 20 U/L (ref 11–51)

## 2015-11-01 LAB — ETHANOL

## 2015-11-01 MED ORDER — ACETAMINOPHEN 325 MG PO TABS: 650.0000 mg | ORAL_TABLET | Freq: Four times a day (QID) | ORAL | Status: DC | PRN

## 2015-11-01 MED ORDER — ONDANSETRON HCL 4 MG PO TABS: 4.0000 mg | ORAL_TABLET | Freq: Four times a day (QID) | ORAL | Status: DC | PRN

## 2015-11-01 MED ORDER — SODIUM CHLORIDE 0.9 % IV SOLN
INTRAVENOUS | Status: AC
  Administered 2015-11-01: 19:00:00 via INTRAVENOUS

## 2015-11-01 MED ORDER — METHYLPREDNISOLONE SODIUM SUCC 125 MG IJ SOLR
125.0000 mg | Freq: Every day | INTRAMUSCULAR | Status: DC
  Administered 2015-11-01: 125 mg via INTRAVENOUS
  Filled 2015-11-01: qty 2

## 2015-11-01 MED ORDER — POTASSIUM CHLORIDE CRYS ER 20 MEQ PO TBCR
40.0000 meq | EXTENDED_RELEASE_TABLET | Freq: Once | ORAL | Status: AC
  Administered 2015-11-01: 40 meq via ORAL
  Filled 2015-11-01: qty 2

## 2015-11-01 MED ORDER — IPRATROPIUM-ALBUTEROL 0.5-2.5 (3) MG/3ML IN SOLN
3.0000 mL | Freq: Four times a day (QID) | RESPIRATORY_TRACT | Status: DC
  Administered 2015-11-01: 3 mL via RESPIRATORY_TRACT
  Filled 2015-11-01: qty 3

## 2015-11-01 MED ORDER — IPRATROPIUM BROMIDE 0.02 % IN SOLN: 0.5000 mg | Freq: Four times a day (QID) | RESPIRATORY_TRACT | Status: DC

## 2015-11-01 MED ORDER — ACETAMINOPHEN 650 MG RE SUPP: 650.0000 mg | Freq: Four times a day (QID) | RECTAL | Status: DC | PRN

## 2015-11-01 MED ORDER — IPRATROPIUM BROMIDE 0.02 % IN SOLN: 0.5000 mg | RESPIRATORY_TRACT | Status: DC | PRN

## 2015-11-01 MED ORDER — ONDANSETRON HCL 4 MG/2ML IJ SOLN
4.0000 mg | Freq: Once | INTRAMUSCULAR | Status: AC
  Administered 2015-11-01: 4 mg via INTRAVENOUS
  Filled 2015-11-01: qty 2

## 2015-11-01 MED ORDER — ALBUTEROL SULFATE (2.5 MG/3ML) 0.083% IN NEBU
5.0000 mg | INHALATION_SOLUTION | Freq: Once | RESPIRATORY_TRACT | Status: AC
  Administered 2015-11-01: 5 mg via RESPIRATORY_TRACT
  Filled 2015-11-01: qty 6

## 2015-11-01 MED ORDER — METOCLOPRAMIDE HCL 5 MG/ML IJ SOLN
10.0000 mg | Freq: Once | INTRAMUSCULAR | Status: AC
  Administered 2015-11-01: 10 mg via INTRAVENOUS
  Filled 2015-11-01: qty 2

## 2015-11-01 MED ORDER — TRAZODONE HCL 50 MG PO TABS
50.0000 mg | ORAL_TABLET | Freq: Every day | ORAL | Status: DC
  Administered 2015-11-01 – 2015-11-03 (×2): 50 mg via ORAL
  Filled 2015-11-01 (×2): qty 1

## 2015-11-01 MED ORDER — ONDANSETRON HCL 4 MG/2ML IJ SOLN
4.0000 mg | Freq: Four times a day (QID) | INTRAMUSCULAR | Status: DC | PRN
  Administered 2015-11-01 – 2015-11-04 (×5): 4 mg via INTRAVENOUS
  Filled 2015-11-01 (×5): qty 2

## 2015-11-01 MED ORDER — ALBUTEROL SULFATE (2.5 MG/3ML) 0.083% IN NEBU: 2.5000 mg | INHALATION_SOLUTION | Freq: Four times a day (QID) | RESPIRATORY_TRACT | Status: DC

## 2015-11-01 MED ORDER — ALBUTEROL (5 MG/ML) CONTINUOUS INHALATION SOLN
10.0000 mg/h | INHALATION_SOLUTION | RESPIRATORY_TRACT | Status: DC
  Administered 2015-11-01: 10 mg/h via RESPIRATORY_TRACT
  Filled 2015-11-01: qty 40

## 2015-11-01 MED ORDER — ALBUTEROL SULFATE (2.5 MG/3ML) 0.083% IN NEBU: 2.5000 mg | INHALATION_SOLUTION | RESPIRATORY_TRACT | Status: DC | PRN

## 2015-11-01 MED ORDER — METHYLPREDNISOLONE SODIUM SUCC 40 MG IJ SOLR
40.0000 mg | Freq: Two times a day (BID) | INTRAMUSCULAR | Status: DC
  Administered 2015-11-02 (×2): 40 mg via INTRAVENOUS
  Filled 2015-11-01 (×2): qty 1

## 2015-11-01 MED ORDER — HYOSCYAMINE SULFATE 0.125 MG SL SUBL
0.2500 mg | SUBLINGUAL_TABLET | Freq: Once | SUBLINGUAL | Status: AC
  Administered 2015-11-01: 0.25 mg via SUBLINGUAL
  Filled 2015-11-01: qty 2

## 2015-11-01 MED ORDER — SODIUM CHLORIDE 0.9 % IV BOLUS (SEPSIS)
2000.0000 mL | Freq: Once | INTRAVENOUS | Status: AC
  Administered 2015-11-01: 2000 mL via INTRAVENOUS

## 2015-11-01 MED ORDER — LORAZEPAM 0.5 MG PO TABS
0.5000 mg | ORAL_TABLET | Freq: Every day | ORAL | Status: DC
  Administered 2015-11-01: 0.5 mg via ORAL
  Filled 2015-11-01: qty 1

## 2015-11-01 MED ORDER — IPRATROPIUM-ALBUTEROL 0.5-2.5 (3) MG/3ML IN SOLN
3.0000 mL | Freq: Three times a day (TID) | RESPIRATORY_TRACT | Status: DC
  Administered 2015-11-02 – 2015-11-04 (×8): 3 mL via RESPIRATORY_TRACT
  Filled 2015-11-01 (×7): qty 3

## 2015-11-01 MED ORDER — DIPHENHYDRAMINE HCL 50 MG/ML IJ SOLN
25.0000 mg | Freq: Once | INTRAMUSCULAR | Status: AC
  Administered 2015-11-01: 25 mg via INTRAVENOUS
  Filled 2015-11-01: qty 1

## 2015-11-01 NOTE — ED Provider Notes (Signed)
CSN: [id number]     Arrival date & time 11/01/15  0800 History   First MD Initiated Contact with Patient 11/01/15 0902     Chief Complaint  Patient presents with  . Abdominal Pain  . Emesis  . Diarrhea  . Insomnia     (Consider location/radiation/quality/duration/timing/severity/associated sxs/prior Treatment) HPI Patient presents with 5 days of vomiting and diarrhea. She's been evaluated in the emergency department x 2. She has persistent vomiting. She's been unable to tolerate PO Reglan. She also has had persistent diarrhea. Patient states she had 6 episodes of vomiting this morning and 2 loose stools. She's had no fever or chills. She states she's also had wheezing and cough that at times is associated with vomiting. She complains of diffuse abdominal pain that is unchanged. She is currently on her menstrual cycle for the past 2 days. Denies leg pain or discharge Past Medical History  Diagnosis Date  . Asthma    Past Surgical History  Procedure Laterality Date  . Mouth surgery     Family History  Problem Relation Age of Onset  . Adopted: Yes  . Family history unknown: Yes   Social History  Substance Use Topics  . Smoking status: Former Games developer  . Smokeless tobacco: Never Used  . Alcohol Use: Yes     Comment: occasionally   OB History    No data available     Review of Systems  Constitutional: Negative for fever and chills.  Respiratory: Positive for cough and wheezing. Negative for shortness of breath.   Cardiovascular: Negative for chest pain.  Gastrointestinal: Positive for nausea, vomiting, abdominal pain and diarrhea. Negative for constipation and blood in stool.  Genitourinary: Positive for vaginal bleeding. Negative for dysuria, frequency, flank pain and pelvic pain.  Musculoskeletal: Negative for myalgias, back pain, neck pain and neck stiffness.  Skin: Negative for rash and wound.  Neurological: Positive for light-headedness. Negative for dizziness, weakness,  numbness and headaches.  All other systems reviewed and are negative.     Allergies  Alcohol-sulfur  Home Medications   Prior to Admission medications   Medication Sig Start Date End Date Taking? Authorizing Provider  albuterol (PROVENTIL HFA;VENTOLIN HFA) 108 (90 Base) MCG/ACT inhaler Inhale 1 puff into the lungs every 6 (six) hours as needed for wheezing or shortness of breath.   Yes Historical Provider, MD  amphetamine-dextroamphetamine (ADDERALL) 10 MG tablet Take 10 mg by mouth 2 (two) times daily as needed (for attention disorder).  08/03/15  Yes Historical Provider, MD  Etonogestrel (NEXPLANON Martin) Inject into the skin.   Yes Historical Provider, MD  LORazepam (ATIVAN) 0.5 MG tablet Take 0.5 mg by mouth at bedtime.  08/08/15  Yes Historical Provider, MD  metoCLOPramide (REGLAN) 10 MG tablet Take 1 tablet (10 mg total) by mouth every 8 (eight) hours as needed for nausea or vomiting. 10/30/15  Yes Jenifer Ernestina Penna, MD  promethazine (PHENERGAN) 25 MG tablet Take 1 tablet (25 mg total) by mouth every 6 (six) hours as needed for nausea or vomiting. 10/30/15  Yes Joycie Peek, PA-C  traZODone (DESYREL) 50 MG tablet Take 50 mg by mouth at bedtime.    Yes Historical Provider, MD   BP 106/62 mmHg  Pulse 47  Temp(Src) 98.3 F (36.8 C) (Oral)  Resp 14  SpO2 98%  LMP 11/01/2015 Physical Exam  Constitutional: She is oriented to person, place, and time. She appears well-developed and well-nourished. No distress.  HENT:  Head: Normocephalic and atraumatic.  Mouth/Throat: Oropharynx is  clear and moist. No oropharyngeal exudate.  Eyes: EOM are normal. Pupils are equal, round, and reactive to light.  Neck: Normal range of motion. Neck supple.  Cardiovascular: Normal rate and regular rhythm.   Pulmonary/Chest: Effort normal. No respiratory distress. She has wheezes (Expiratory wheezing throughout). She has no rales.  Abdominal: Soft. Bowel sounds are normal. She exhibits no distension  and no mass. There is tenderness (Mild diffuse tenderness without focality.). There is no rebound and no guarding.  Musculoskeletal: Normal range of motion. She exhibits no edema or tenderness.  No CVA tenderness bilaterally.  Neurological: She is alert and oriented to person, place, and time.  Awake and alert. Moves all extremities without deficit. Sensation is fully intact.  Skin: Skin is warm and dry. No rash noted. No erythema.  Psychiatric: She has a normal mood and affect. Her behavior is normal.  Nursing note and vitals reviewed.   ED Course  Procedures (including critical care time) Labs Review Labs Reviewed  COMPREHENSIVE METABOLIC PANEL - Abnormal; Notable for the following:    Potassium 3.4 (*)    All other components within normal limits  URINALYSIS, ROUTINE W REFLEX MICROSCOPIC (NOT AT [facility]) - Abnormal; Notable for the following:    Ketones, ur >80 (*)    All other components within normal limits  LIPASE, BLOOD  CBC  I-STAT BETA HCG BLOOD, ED ([facility], [facility], [facility] ONLY)    Imaging Review Dg Abd Acute W/chest  11/01/2015  CLINICAL DATA:  Intermittent diarrhea and vomiting over the past 5 days EXAM: DG ABDOMEN ACUTE W/ 1V CHEST COMPARISON:  10/30/2015 FINDINGS: There is no evidence of dilated bowel loops or free intraperitoneal air. No radiopaque calculi or other significant radiographic abnormality is seen. Heart size and mediastinal contours are within normal limits. Both lungs are clear. IMPRESSION: Negative abdominal radiographs.  No acute cardiopulmonary disease. Electronically Signed   By: Alcide CleverMark  Lukens M.D.   On: 11/01/2015 12:03   I have personally reviewed and evaluated these images and lab results as part of my medical decision-making.   EKG Interpretation None      MDM   Final diagnoses:  Vomiting and diarrhea  Wheezing    Patient asked me to call her uncle is a Transport plannerpediatric surgeon to discuss her symptoms. Discussed length. Patient's abdominal exam is completely  benign. She has a normal white blood cell count. She does have ongoing wheezing and was given albuterol treatment in emergency department. Also given IV fluids. She is in no distress and has had no vomiting in the emergency department.    Patient with persistent wheezing and shortness of breath despite continuous nebulized treatment and IV Solu-Medrol. She's had no further vomiting in the emergency department. Chest x-ray without evidence of pneumonia. Given persistent wheezing and third visit in the last few days will admit to observation. Discussed with Dr. Doneen Poissonevine  Kimaria Struthers, MD 11/01/15 [phone]

## 2015-11-01 NOTE — ED Notes (Addendum)
Patient reports that she has had intermittent diarrhea, vomiting, but states she has constant abdominal burning and pain x 5 days. Patient was seen in the ED 2 days ago for the same.

## 2015-11-01 NOTE — H&P (Signed)
[facility] History and Physical  Dana Travis WUJ:[id number] DOB: 03/10/1996 DOA: 11/01/2015  Referring physician: ER physician: Dr. Loren Racer  PCP: Pcp Not In System - will set up with [facility] on discharge   Chief Complaint: nausea and vomiting  HPI:  20 year old female, goes to school in this area but not from [place] area otherwise. She presented to ED 3/11 and 3/12 with essentially similar reports of nausea, vomiting, not feeling well. Both time she was given either Reglan or phenergan and sent home but this time she says cant go home because her symptoms are not improving. She reports cramp like abdominal discomfort which intermittently gets better after vomiting. No reports of fever. She did have episode of diarrhea but no blood in stool. No urinary complaints. Pt further reported having wheezing since this am. She has used inhaler every few hours at home without symptomatic relief.    In ED, BP was 106/62, temp 99.3 F, oxygen saturation was 93% with [place] oxygen support. Blood work showed mild leukocytosis which has resolved on subsequent labs. She had ketones on UA but no other abnormalities. CXR showed no active disease on 3/12 , abd x ray on this admission showed no acute findings.She was wheezing even with continuous nebulizer in ED. She was given solumedrol 125 mg IV once. [facility] asked to admit for further management of N/V and wheezing.   Assessment & Plan    Principal Problem:   Acute respiratory failure with hypoxia (HCC) /  Asthma, chronic obstructive, with acute exacerbation (HCC) - Started albuterol and Atrovent every 6 hours scheduled and every 2 hours PRN shortness of breath and/or wheezing - Started solumedrol 40 mg IV Q 12 hours - Oxygen support via [place] to keep O2 sats above 90%  Active Problems:   Nausea and vomiting / Leukocytosis  - Unclear etiology, possibly viral gastroenteritis - LFT's and lipase WNL, beta hCG negative. She has ketones in urine but normal  CBG, glucose, anion gap. Will check UDS and alcohol level - Diet as tolerated per her request - May continue IV fluids for hydration    Hypokalemia - Due to GI losses - Supplemented - Check BMP in am  DVT prophylaxis:  - SCD's bilaterally   Radiological Exams on Admission: Dg Abd Acute W/chest 11/01/2015   Negative abdominal radiographs.  No acute cardiopulmonary disease. Electronically Signed   By: Alcide Clever M.D.   On: 11/01/2015 12:03    EKG: I have personally reviewed EKG. EKG shows sinus tachycardia   Code Status: Full Family Communication: Plan of care discussed with the patient  Disposition Plan: Admit for further evaluation  Manson Passey, MD  [facility] Pager [phone]  Time spent in minutes: 75 minutes  Review of Systems:  Constitutional: Negative for fever, chills and malaise/fatigue. Negative for diaphoresis.  HENT: Negative for hearing loss, ear pain, nosebleeds, congestion, sore throat, neck pain, tinnitus and ear discharge.   Eyes: Negative for blurred vision, double vision, photophobia, pain, discharge and redness.  Respiratory: per HPI Cardiovascular: Negative for chest pain, palpitations, orthopnea, claudication and leg swelling.  Gastrointestinal: Negative for nausea, vomiting and abdominal pain. Negative for heartburn, constipation, blood in stool and melena.  Genitourinary: Negative for dysuria, urgency, frequency, hematuria and flank pain.  Musculoskeletal: Negative for myalgias, back pain, joint pain and falls.  Skin: Negative for itching and rash.  Neurological: Negative for dizziness and weakness. Negative for tingling, tremors, sensory change, speech change, focal weakness, loss of consciousness and headaches.  Endo/Heme/Allergies: Negative for environmental allergies and polydipsia. Does not bruise/bleed easily.  Psychiatric/Behavioral: Negative for suicidal ideas. The patient is not nervous/anxious.      Past Medical History  Diagnosis  Date  . Asthma    Past Surgical History  Procedure Laterality Date  . Mouth surgery     Social History:  reports that she has quit smoking. She has never used smokeless tobacco. She reports that she drinks alcohol. She reports that she uses illicit drugs (Marijuana).  Allergies  Allergen Reactions  . Alcohol-Sulfur [Sulfur] Hives    Rubbing alcohol.     Family History:  Family History  Problem Relation Age of Onset  . Adopted: Yes  . Family history unknown: Yes     Prior to Admission medications   Medication Sig Start Date End Date Taking? Authorizing Provider  albuterol (PROVENTIL HFA;VENTOLIN HFA) 108 (90 Base) MCG/ACT inhaler Inhale 1 puff into the lungs every 6 (six) hours as needed for wheezing or shortness of breath.   Yes Historical Provider, MD  amphetamine-dextroamphetamine (ADDERALL) 10 MG tablet Take 10 mg by mouth 2 (two) times daily as needed (for attention disorder).  08/03/15  Yes Historical Provider, MD  Etonogestrel (NEXPLANON [place]) Inject into the skin.   Yes Historical Provider, MD  LORazepam (ATIVAN) 0.5 MG tablet Take 0.5 mg by mouth at bedtime.  08/08/15  Yes Historical Provider, MD  metoCLOPramide (REGLAN) 10 MG tablet Take 1 tablet (10 mg total) by mouth every 8 (eight) hours as needed for nausea or vomiting. 10/30/15  Yes Jenifer Ernestina PennaBrunno Irick, MD  promethazine (PHENERGAN) 25 MG tablet Take 1 tablet (25 mg total) by mouth every 6 (six) hours as needed for nausea or vomiting. 10/30/15  Yes Joycie PeekBenjamin Cartner, PA-C  traZODone (DESYREL) 50 MG tablet Take 50 mg by mouth at bedtime.    Yes Historical Provider, MD   Physical Exam: Filed Vitals:   11/01/15 1051 11/01/15 1249 11/01/15 1420 11/01/15 1543  BP: 123/79 106/62  133/53  Pulse: 49 47  118  Temp: 98.3 F (36.8 C)     TempSrc: Oral     Resp: 16 14  15   SpO2: 97% 100% 98% 93%    Physical Exam  Constitutional: Appears well-developed and well-nourished. No distress.  HENT: Normocephalic. No tonsillar  erythema or exudates Eyes: Conjunctivae are normal. No scleral icterus.  Neck: Normal ROM. Neck supple. No JVD. No tracheal deviation. No thyromegaly.  CVS: RRR, S1/S2 +, no murmurs, no gallops, no carotid bruit.  Pulmonary: wheezing in upper and mid lug lobes, no rhonchi.  Abdominal: Soft. BS +,  no distension, tenderness, rebound or guarding.  Musculoskeletal: Normal range of motion. No edema and no tenderness.  Lymphadenopathy: No lymphadenopathy noted, cervical, inguinal. Neuro: Alert. Normal reflexes, muscle tone coordination. No focal neurologic deficits. Skin: Skin is warm and dry. No rash noted.  No erythema. No pallor.  Psychiatric: Normal mood and affect. Behavior, judgment, thought content normal.   Labs on Admission:  Basic Metabolic Panel:  Recent Labs Lab 10/29/15 1637 10/30/15 1350 11/01/15 0840  NA 135 141 143  K 3.7 3.7 3.4*  CL 100* 108 107  CO2 22 24 23   GLUCOSE 93 92 91  BUN 9 14 7   CREATININE 1.03* 0.83 0.69  CALCIUM 9.7 8.8* 9.5   Liver Function Tests:  Recent Labs Lab 10/29/15 1637 11/01/15 0840  AST 31 35  ALT 18 32  ALKPHOS 57 49  BILITOT 0.5 1.1  PROT 8.3* 7.4  ALBUMIN 4.8  4.2    Recent Labs Lab 10/29/15 1637 11/01/15 0840  LIPASE 16 20   No results for input(s): AMMONIA in the last 168 hours. CBC:  Recent Labs Lab 10/29/15 1637 10/30/15 1350 11/01/15 0840  WBC 7.1 13.0* 9.1  NEUTROABS  --  10.6*  --   HGB 14.9 14.0 14.0  HCT 45.1 40.6 42.5  MCV 87.2 85.3 89.1  PLT 372 342 205   Cardiac Enzymes: No results for input(s): CKTOTAL, CKMB, CKMBINDEX, TROPONINI in the last 168 hours. BNP: Invalid input(s): POCBNP CBG: No results for input(s): GLUCAP in the last 168 hours.  If 7PM-7AM, please contact night-coverage [email or web address] Password [facility] 11/01/2015, 3:57 PM

## 2015-11-01 NOTE — ED Notes (Signed)
Patient transported to X-ray 

## 2015-11-02 ENCOUNTER — Inpatient Hospital Stay (HOSPITAL_COMMUNITY): Payer: Managed Care, Other (non HMO)

## 2015-11-02 DIAGNOSIS — R7401 Elevation of levels of liver transaminase levels: Secondary | ICD-10-CM

## 2015-11-02 DIAGNOSIS — R740 Nonspecific elevation of levels of transaminase and lactic acid dehydrogenase [LDH]: Secondary | ICD-10-CM

## 2015-11-02 LAB — CBC
HEMATOCRIT: 35.9 % — AB (ref 36.0–46.0)
HEMOGLOBIN: 12.2 g/dL (ref 12.0–15.0)
MCH: 29 pg (ref 26.0–34.0)
MCHC: 34 g/dL (ref 30.0–36.0)
MCV: 85.5 fL (ref 78.0–100.0)
Platelets: 314 10*3/uL (ref 150–400)
RBC: 4.2 MIL/uL (ref 3.87–5.11)
RDW: 13.2 % (ref 11.5–15.5)
WBC: 15.6 10*3/uL — AB (ref 4.0–10.5)

## 2015-11-02 LAB — COMPREHENSIVE METABOLIC PANEL
ALBUMIN: 3.6 g/dL (ref 3.5–5.0)
ALT: 63 U/L — ABNORMAL HIGH (ref 14–54)
ANION GAP: 11 (ref 5–15)
AST: 55 U/L — AB (ref 15–41)
Alkaline Phosphatase: 45 U/L (ref 38–126)
BILIRUBIN TOTAL: 0.8 mg/dL (ref 0.3–1.2)
BUN: 6 mg/dL (ref 6–20)
CHLORIDE: 107 mmol/L (ref 101–111)
CO2: 26 mmol/L (ref 22–32)
Calcium: 8.6 mg/dL — ABNORMAL LOW (ref 8.9–10.3)
Creatinine, Ser: 0.75 mg/dL (ref 0.44–1.00)
GFR calc Af Amer: >60 mL/min (ref >60)
Glucose, Bld: 107 mg/dL — ABNORMAL HIGH (ref 65–99)
POTASSIUM: 3.5 mmol/L (ref 3.5–5.1)
Sodium: 144 mmol/L (ref 135–145)
TOTAL PROTEIN: 6.6 g/dL (ref 6.5–8.1)

## 2015-11-02 LAB — GLUCOSE, CAPILLARY: GLUCOSE-CAPILLARY: 109 mg/dL — AB (ref 65–99)

## 2015-11-02 MED ORDER — PREDNISONE 50 MG PO TABS
50.0000 mg | ORAL_TABLET | Freq: Every day | ORAL | Status: DC
  Administered 2015-11-03: 50 mg via ORAL
  Filled 2015-11-02: qty 1

## 2015-11-02 MED ORDER — DIPHENHYDRAMINE HCL 50 MG/ML IJ SOLN
12.5000 mg | Freq: Every evening | INTRAMUSCULAR | Status: DC | PRN
  Administered 2015-11-02: 12.5 mg via INTRAVENOUS
  Filled 2015-11-02: qty 1

## 2015-11-02 NOTE — Progress Notes (Signed)
PROGRESS NOTE  Dana Travis UJW:[id number] DOB: Jun 05, 1996 DOA: 11/01/2015 PCP: Pcp Not In System Brief History 20 year old female with a history of asthma presented with 3-4 day history of shortness of breath and associated nausea and vomiting. The patient had ED visits on 10/29/2015 as well as 11/01/2015 after both of which the patient was sent home in stable condition.  The patient returned on the evening of 11/01/2015 with worsening shortness of breath and vomiting. The patient stated she had a fever 101.74F on 10/28/2015 [facility]  The patient was given empiric oseltamivir of which she only took 1 dose before she began having nausea and vomiting. She had not taken her temperature since her [facility] visit in addition, the patient began having crampy abdominal pain and loose stools on the day of admission.  Assessment/Plan: Acute asthma exacerbation -Presently stable on room air -Transition to oral prednisone -Continue bronchodilators -Pulmonary hygiene -Chest x-ray negative for infiltrates Intractable vomiting and diarrhea -Suspect viral gastroenteritis--no vomiting on 11/02/2015 -Suspect the patient's clinical syndrome may have been incited by viral infection which may also account for her asthma exacerbation -Advance diet -Urinalysis negative for pyuria -Beta hCG negative -Abdominal x-ray negative for dilated loops of bowel Transaminasemia -?viral infx -RUQ ultrasound -HIV -repeat LFTs in am Hypokalemia -Replete -Check magnesium -Secondary to GI loss Cannabis use -cessation discussed  Family Communication:   Pt at beside Disposition Plan:   Home 1-2 days       Procedures/Studies: Dg Chest 2 View  10/30/2015  CLINICAL DATA:  The tip fever and emesis.  Cough. EXAM: CHEST - 2 VIEW COMPARISON:  Two-view chest x-ray 11/12/2012. FINDINGS: The heart size and mediastinal contours are within normal limits. Both lungs are clear. The  visualized skeletal structures are unremarkable. IMPRESSION: Negative two view chest x-ray Electronically Signed   By: Marin Robertshristopher  Mattern M.D.   On: 10/30/2015 14:45   Dg Abd Acute W/chest  11/01/2015  CLINICAL DATA:  Intermittent diarrhea and vomiting over the past 5 days EXAM: DG ABDOMEN ACUTE W/ 1V CHEST COMPARISON:  10/30/2015 FINDINGS: There is no evidence of dilated bowel loops or free intraperitoneal air. No radiopaque calculi or other significant radiographic abnormality is seen. Heart size and mediastinal contours are within normal limits. Both lungs are clear. IMPRESSION: Negative abdominal radiographs.  No acute cardiopulmonary disease. Electronically Signed   By: Alcide CleverMark  Lukens M.D.   On: 11/01/2015 12:03         Subjective: Patient is breathing better. She denies any vomiting, diarrhea, abdominal pain, headache, chest pain, hemoptysis, dysuria, hematuria. Denies any headache or neck pain. No rashes.   Objective: Filed Vitals:   11/02/15 0635 11/02/15 0841 11/02/15 1355 11/02/15 1430  BP: 125/69   139/71  Pulse: 66 60  85  Temp: 98.3 F (36.8 C)   98.2 F (36.8 C)  TempSrc: Oral   Oral  Resp: 18 18  20   Height:      Weight: 85.775 kg (189 lb 1.6 oz)     SpO2: 98% 98% 96% 96%    Intake/Output Summary (Last 24 hours) at 11/02/15 1726 Last data filed at 11/01/15 2147  Gross per 24 hour  Intake      0 ml  Output    800 ml  Net   -800 ml   Weight change:  Exam:   General:  Pt is alert, follows commands appropriately, not in acute distress  HEENT:  No icterus, No thrush, No neck mass, [place]/AT  Cardiovascular: RRR, S1/S2, no rubs, no gallops  Respiratory: Mild bibasilar wheezing. Good air movement.   Abdomen: Soft/+BS, non tender, non distended, no guarding; no hepatosplenomegaly   Extremities: No edema, No lymphangitis, No petechiae, No rashes, no synovitis  Data Reviewed: Basic Metabolic Panel:  Recent Labs Lab 10/29/15 1637 10/30/15 1350 11/01/15 0840  11/02/15 0350  NA 135 141 143 144  K 3.7 3.7 3.4* 3.5  CL 100* 108 107 107  CO2 GLUCOSE 93 92 91 107*  BUN CREATININE 1.03* 0.83 0.69 0.75  CALCIUM 9.7 8.8* 9.5 8.6*   Liver Function Tests:  Recent Labs Lab 10/29/15 1637 11/01/15 0840 11/02/15 0350  AST 31 35 55*  ALT 18 32 63*  ALKPHOS 57 49 45  BILITOT 0.5 1.1 0.8  PROT 8.3* 7.4 6.6  ALBUMIN 4.8 4.2 3.6    Recent Labs Lab 10/29/15 1637 11/01/15 0840  LIPASE 16 20   No results for input(s): AMMONIA in the last 168 hours. CBC:  Recent Labs Lab 10/29/15 1637 10/30/15 1350 11/01/15 0840 11/02/15 0350  WBC 7.1 13.0* 9.1 15.6*  NEUTROABS  --  10.6*  --   --   HGB 14.9 14.0 14.0 12.2  HCT 45.1 40.6 42.5 35.9*  MCV 87.2 85.3 89.1 85.5  PLT 372 342 205 314   Cardiac Enzymes: No results for input(s): CKTOTAL, CKMB, CKMBINDEX, TROPONINI in the last 168 hours. BNP: Invalid input(s): POCBNP CBG:  Recent Labs Lab 11/02/15 0633  GLUCAP 109*    No results found for this or any previous visit (from the past 240 hour(s)).   Scheduled Meds: . ipratropium-albuterol  3 mL Nebulization TID  . [START ON 11/03/2015] predniSONE  50 mg Oral Q breakfast  . traZODone  50 mg Oral QHS   Continuous Infusions: . albuterol Stopped (11/01/15 1609)     Monico Sudduth, DO  [facility] Pager [phone]  If 7PM-7AM, please contact night-coverage [email or web address] Password [email or web address] 11/02/2015, 5:26 PM   LOS: 1 day

## 2015-11-02 NOTE — Progress Notes (Signed)
Initial Nutrition Assessment  DOCUMENTATION CODES:   Not applicable  INTERVENTION:  -RD to continue to monitor for needs  NUTRITION DIAGNOSIS:   Inadequate oral intake related to poor appetite, vomiting, nausea as evidenced by per patient/family report.  GOAL:   Patient will meet greater than or equal to 90% of their needs  MONITOR:   PO intake, I & O's, Labs, Weight trends  REASON FOR ASSESSMENT:   Malnutrition Screening Tool    ASSESSMENT:   20 year old female, goes to school in this area [place] from [place]. She presented to ED 3/11 and 3/12 with essentially similar reports of nausea, vomiting, not feeling well. Both time she was given either Reglan or phenergan and sent home but this time she says cant go home because her symptoms are not improving.  Spoke with Ms Patricia NettleFishman at bedside. She still feels generally weak. Appetite is poor, still experiencing some nausea. She has been drinking mostly clear liquids and juice. Has not attempted to keep any solid food down. She seems a little nervous and anxious. Endorses weight loss of about 5# since admission. Was admitted Monday, had not eaten anything since the previous Wednesday.   She asked if she would be discharged today. Encouraged her to eat trays, would increase chances of discharge.  Nutrition-Focused physical exam completed. Findings are no fat depletion, no muscle depletion, and no edema.   Labs and medications reviewed.  Diet Order:  Diet regular Room service appropriate?: Yes; Fluid consistency:: Thin  Skin:  Reviewed, no issues  Last BM:  3/14  Height:   Ht Readings from Last 1 Encounters:  11/01/15 5\' 4"  (1.626 m)    Weight:   Wt Readings from Last 1 Encounters:  11/02/15 189 lb 1.6 oz (85.775 kg)    Ideal Body Weight:  54.54 kg  BMI:  Body mass index is 32.44 kg/(m^2).  Estimated Nutritional Needs:   Kcal:  1550-1850  Protein:  85-100 grams  Fluid:  >/=  1.5L  EDUCATION NEEDS:   No education needs identified at this time  Dionne AnoWilliam M. Zaul Hubers, MS, RD LDN After Hours/Weekend Pager [phone]

## 2015-11-03 ENCOUNTER — Inpatient Hospital Stay (HOSPITAL_COMMUNITY): Payer: Managed Care, Other (non HMO)

## 2015-11-03 DIAGNOSIS — R932 Abnormal findings on diagnostic imaging of liver and biliary tract: Secondary | ICD-10-CM | POA: Insufficient documentation

## 2015-11-03 LAB — COMPREHENSIVE METABOLIC PANEL
ALBUMIN: 3.6 g/dL (ref 3.5–5.0)
ALK PHOS: 42 U/L (ref 38–126)
ALT: 48 U/L (ref 14–54)
ANION GAP: 9 (ref 5–15)
AST: 25 U/L (ref 15–41)
BUN: 15 mg/dL (ref 6–20)
CO2: 25 mmol/L (ref 22–32)
Calcium: 8.8 mg/dL — ABNORMAL LOW (ref 8.9–10.3)
Chloride: 106 mmol/L (ref 101–111)
Creatinine, Ser: 0.83 mg/dL (ref 0.44–1.00)
GFR calc Af Amer: >60 mL/min (ref >60)
GFR calc non Af Amer: >60 mL/min (ref >60)
GLUCOSE: 103 mg/dL — AB (ref 65–99)
POTASSIUM: 3.8 mmol/L (ref 3.5–5.1)
SODIUM: 140 mmol/L (ref 135–145)
Total Bilirubin: 0.5 mg/dL (ref 0.3–1.2)
Total Protein: 6.6 g/dL (ref 6.5–8.1)

## 2015-11-03 LAB — CBC
HEMATOCRIT: 40.2 % (ref 36.0–46.0)
HEMOGLOBIN: 12.9 g/dL (ref 12.0–15.0)
MCH: 29.1 pg (ref 26.0–34.0)
MCHC: 32.1 g/dL (ref 30.0–36.0)
MCV: 90.7 fL (ref 78.0–100.0)
Platelets: 339 10*3/uL (ref 150–400)
RBC: 4.43 MIL/uL (ref 3.87–5.11)
RDW: 13.6 % (ref 11.5–15.5)
WBC: 15.8 10*3/uL — ABNORMAL HIGH (ref 4.0–10.5)

## 2015-11-03 LAB — GLUCOSE, CAPILLARY: Glucose-Capillary: 75 mg/dL (ref 65–99)

## 2015-11-03 LAB — HIV ANTIBODY (ROUTINE TESTING W REFLEX): HIV Screen 4th Generation wRfx: NONREACTIVE

## 2015-11-03 LAB — MAGNESIUM: Magnesium: 2.5 mg/dL — ABNORMAL HIGH (ref 1.7–2.4)

## 2015-11-03 MED ORDER — SODIUM CHLORIDE 0.9 % IV SOLN
INTRAVENOUS | Status: DC
  Filled 2015-11-03: qty 1000

## 2015-11-03 MED ORDER — MORPHINE SULFATE (PF) 2 MG/ML IV SOLN
1.0000 mg | INTRAVENOUS | Status: DC | PRN
  Administered 2015-11-03 – 2015-11-04 (×3): 1 mg via INTRAVENOUS
  Filled 2015-11-03 (×3): qty 1

## 2015-11-03 MED ORDER — POTASSIUM CHLORIDE IN NACL 20-0.9 MEQ/L-% IV SOLN
INTRAVENOUS | Status: DC
  Administered 2015-11-03 – 2015-11-04 (×2): via INTRAVENOUS
  Filled 2015-11-03 (×3): qty 1000

## 2015-11-03 MED ORDER — PANTOPRAZOLE SODIUM 40 MG IV SOLR
40.0000 mg | INTRAVENOUS | Status: DC
  Administered 2015-11-03: 40 mg via INTRAVENOUS
  Filled 2015-11-03: qty 40

## 2015-11-03 MED ORDER — METHYLPREDNISOLONE SODIUM SUCC 40 MG IJ SOLR
40.0000 mg | Freq: Every day | INTRAMUSCULAR | Status: DC
  Administered 2015-11-03 – 2015-11-04 (×2): 40 mg via INTRAVENOUS
  Filled 2015-11-03 (×2): qty 1

## 2015-11-03 MED ORDER — GADOBENATE DIMEGLUMINE 529 MG/ML IV SOLN
20.0000 mL | Freq: Once | INTRAVENOUS | Status: AC | PRN
  Administered 2015-11-03: 16 mL via INTRAVENOUS

## 2015-11-03 NOTE — Progress Notes (Signed)
PROGRESS NOTE  Dana Travis AVW:[id number] DOB: July 17, 1996 DOA: 11/01/2015 PCP: Pcp Not In System  Brief History 20 year old female with a history of asthma presented with 3-4 day history of shortness of breath and associated nausea and vomiting. The patient had ED visits on 10/29/2015 as well as 11/01/2015 after both of which the patient was sent home in stable condition. The patient returned on the evening of 11/01/2015 with worsening shortness of breath and vomiting. The patient stated she had a fever 101.87F on 10/28/2015 [facility] The patient was given empiric oseltamivir of which she only took 1 dose before she began having nausea and vomiting. She had not taken her temperature since her [facility] in addition, the patient began having crampy abdominal pain and loose stools on the day of admission.  Assessment/Plan: Acute asthma exacerbation -Presently stable on room air -Transition to oral prednisone -Continue bronchodilators -Pulmonary hygiene -Chest x-ray negative for infiltrates Intractable vomiting and diarrhea -Suspect viral gastroenteritis--no vomiting on 11/02/2015 but vomited am 11/03/15 -?post tussive emesis vs due to meds (prednisone)--pt able to tolerate diet -Suspect the patient's clinical syndrome may have been incited by viral infection which may also account for her asthma exacerbation -?cyclic vomiting from [facility]? -place on soft diet -start PPI -Urinalysis negative for pyuria -Beta hCG negative -Abdominal x-ray negative for dilated loops of bowel -RUQ ultrasound--negative for cholelithiasis or gallbladder wall thickening; focal hepatic echogenicity in the left hepatic lobe--> MRI liver Transaminasemia -?viral infx -RUQ ultrasound--as above -HIV--negative -repeat LFTs in am Hypokalemia -Repleted -Check magnesium -Secondary to GI loss Cannabis use -cessation discussed  Family Communication: Pt at  beside Disposition Plan: Home 1-2 days   Procedures/Studies: Dg Chest 2 View  10/30/2015  CLINICAL DATA:  The tip fever and emesis.  Cough. EXAM: CHEST - 2 VIEW COMPARISON:  Two-view chest x-ray 11/12/2012. FINDINGS: The heart size and mediastinal contours are within normal limits. Both lungs are clear. The visualized skeletal structures are unremarkable. IMPRESSION: Negative two view chest x-ray Electronically Signed   By: Marin Roberts M.D.   On: 10/30/2015 14:45   Dg Abd Acute W/chest  11/01/2015  CLINICAL DATA:  Intermittent diarrhea and vomiting over the past 5 days EXAM: DG ABDOMEN ACUTE W/ 1V CHEST COMPARISON:  10/30/2015 FINDINGS: There is no evidence of dilated bowel loops or free intraperitoneal air. No radiopaque calculi or other significant radiographic abnormality is seen. Heart size and mediastinal contours are within normal limits. Both lungs are clear. IMPRESSION: Negative abdominal radiographs.  No acute cardiopulmonary disease. Electronically Signed   By: Alcide Clever M.D.   On: 11/01/2015 12:03   US Abdomen Limited Ruq  11/03/2015  CLINICAL DATA:  Elevated transaminase levels. EXAM: US ABDOMEN LIMITED - RIGHT UPPER QUADRANT COMPARISON:  None. FINDINGS: Gallbladder: No gallstones or wall thickening visualized. No sonographic Murphy sign noted by sonographer. Common bile duct: Diameter: 2 mm Liver: Sonography identifies a 7.1 by 1.8 by 4.0 cm hyperechoic region in the left hepatic lobe. No other focal lesions identified. No intrahepatic biliary dilatation. IMPRESSION: 1. Focal hepatic hyperechogenicity in the left hepatic lobe, differential diagnostic considerations include focal steatosis or mass. In the context of the patient's abnormal transaminase levels, hepatic protocol MRI with and without contrast is recommended for further workup. Electronically Signed   By: Gaylyn Rong M.D.   On: 11/03/2015 08:25         Subjective: Patient had an episode of  emesis last  evening and this morning. She felt like this was related to her coughing episode as well as from taking her prednisone. The patient was able to eat lunch without any difficulty. Denies any fevers, chills, chest pain, diarrhea, and occasional melena. Breathing overall is improving. Denies any chest pain. Denies any headache or neck pain.  Objective: Filed Vitals:   11/03/15 0520 11/03/15 0924 11/03/15 1301 11/03/15 1449  BP: 128/81  140/71   Pulse: 73  56   Temp: 98.9 F (37.2 C)  98 F (36.7 C)   TempSrc: Oral  Oral   Resp: 20  18   Height:      Weight: 79.425 kg (175 lb 1.6 oz)     SpO2: 96% 97% 98% 95%    Intake/Output Summary (Last 24 hours) at 11/03/15 1841 Last data filed at 11/03/15 1300  Gross per 24 hour  Intake    600 ml  Output    300 ml  Net    300 ml   Weight change: -0.408 kg (-14.4 oz) Exam:   General:  Pt is alert, follows commands appropriately, not in acute distress  HEENT: No icterus, No thrush, No neck mass, [place]/AT  Cardiovascular: RRR, S1/S2, no rubs, no gallops  Respiratory: Minimal basilar wheezing with basilar rales. Good air movement.  Abdomen: Soft/+BS, non tender, non distended, no guarding; no hepatosplenomegaly  Extremities: No edema, No lymphangitis, No petechiae, No rashes, no synovitis  Data Reviewed: Basic Metabolic Panel:  Recent Labs Lab 10/29/15 1637 10/30/15 1350 11/01/15 0840 11/02/15 0350 11/03/15 0356  NA 135 141 143 144 140  K 3.7 3.7 3.4* 3.5 3.8  CL 100* 108 107 107 106  CO2 22 24 23 26 25   GLUCOSE 93 92 91 107* 103*  BUN 9 14 7 6 15   CREATININE 1.03* 0.83 0.69 0.75 0.83  CALCIUM 9.7 8.8* 9.5 8.6* 8.8*  MG  --   --   --   --  2.5*   Liver Function Tests:  Recent Labs Lab 10/29/15 1637 11/01/15 0840 11/02/15 0350 11/03/15 0356  AST 31 35 55* 25  ALT 18 32 63* 48  ALKPHOS 57 49 45 42  BILITOT 0.5 1.1 0.8 0.5  PROT 8.3* 7.4 6.6 6.6  ALBUMIN 4.8 4.2 3.6 3.6    Recent Labs Lab 10/29/15 1637  11/01/15 0840  LIPASE 16 20   No results for input(s): AMMONIA in the last 168 hours. CBC:  Recent Labs Lab 10/29/15 1637 10/30/15 1350 11/01/15 0840 11/02/15 0350 11/03/15 0356  WBC 7.1 13.0* 9.1 15.6* 15.8*  NEUTROABS  --  10.6*  --   --   --   HGB 14.9 14.0 14.0 12.2 12.9  HCT 45.1 40.6 42.5 35.9* 40.2  MCV 87.2 85.3 89.1 85.5 90.7  PLT 372 342 205 314 339   Cardiac Enzymes: No results for input(s): CKTOTAL, CKMB, CKMBINDEX, TROPONINI in the last 168 hours. BNP: Invalid input(s): POCBNP CBG:  Recent Labs Lab 11/02/15 0633 11/03/15 0757  GLUCAP 109* 75    No results found for this or any previous visit (from the past 240 hour(s)).   Scheduled Meds: . ipratropium-albuterol  3 mL Nebulization TID  . methylPREDNISolone (SOLU-MEDROL) injection  40 mg Intravenous Daily  . pantoprazole (PROTONIX) IV  40 mg Intravenous Q24H  . traZODone  50 mg Oral QHS   Continuous Infusions: . 0.9 % NaCl with KCl 20 mEq / L 75 mL/hr at 11/03/15 1545  . albuterol Stopped (11/01/15 1609)  Diedre Maclellan, DO  [facility] Pager [phone]  If 7PM-7AM, please contact night-coverage [email or web address] Password [facility] 11/03/2015, 6:41 PM   LOS: 2 days

## 2015-11-04 ENCOUNTER — Inpatient Hospital Stay (HOSPITAL_COMMUNITY): Payer: Managed Care, Other (non HMO)

## 2015-11-04 DIAGNOSIS — R109 Unspecified abdominal pain: Secondary | ICD-10-CM | POA: Insufficient documentation

## 2015-11-04 DIAGNOSIS — R1011 Right upper quadrant pain: Secondary | ICD-10-CM

## 2015-11-04 LAB — HEPATITIS C ANTIBODY: HCV Ab: <0.1 s/co ratio (ref 0.0–0.9)

## 2015-11-04 LAB — COMPREHENSIVE METABOLIC PANEL
ALBUMIN: 3.6 g/dL (ref 3.5–5.0)
ALT: 37 U/L (ref 14–54)
AST: 16 U/L (ref 15–41)
Alkaline Phosphatase: 44 U/L (ref 38–126)
Anion gap: 8 (ref 5–15)
BUN: 13 mg/dL (ref 6–20)
CHLORIDE: 107 mmol/L (ref 101–111)
CO2: 26 mmol/L (ref 22–32)
CREATININE: 0.81 mg/dL (ref 0.44–1.00)
Calcium: 9 mg/dL (ref 8.9–10.3)
GFR calc Af Amer: >60 mL/min (ref >60)
GLUCOSE: 96 mg/dL (ref 65–99)
Potassium: 4.3 mmol/L (ref 3.5–5.1)
Sodium: 141 mmol/L (ref 135–145)
Total Bilirubin: 0.5 mg/dL (ref 0.3–1.2)
Total Protein: 6.6 g/dL (ref 6.5–8.1)

## 2015-11-04 LAB — HEPATITIS B SURFACE ANTIGEN: HEP B S AG: NEGATIVE

## 2015-11-04 LAB — GLUCOSE, CAPILLARY: GLUCOSE-CAPILLARY: 77 mg/dL (ref 65–99)

## 2015-11-04 MED ORDER — PREDNISONE 20 MG PO TABS: 40.0000 mg | ORAL_TABLET | Freq: Every day | ORAL | Status: AC

## 2015-11-04 MED ORDER — SUCRALFATE 1 G PO TABS: 1.0000 g | ORAL_TABLET | Freq: Every day | ORAL | Status: AC

## 2015-11-04 MED ORDER — ONDANSETRON 8 MG PO TBDP: 8.0000 mg | ORAL_TABLET | Freq: Three times a day (TID) | ORAL | Status: AC | PRN

## 2015-11-04 MED ORDER — SINCALIDE 5 MCG IJ SOLR
1.6000 ug | Freq: Once | INTRAMUSCULAR | Status: AC
  Administered 2015-11-04: 1.6 ug via INTRAVENOUS

## 2015-11-04 MED ORDER — OMEPRAZOLE 40 MG PO CPDR: 40.0000 mg | DELAYED_RELEASE_CAPSULE | Freq: Every day | ORAL | Status: AC

## 2015-11-04 MED ORDER — ALBUTEROL SULFATE HFA 108 (90 BASE) MCG/ACT IN AERS: 2.0000 | INHALATION_SPRAY | Freq: Four times a day (QID) | RESPIRATORY_TRACT | Status: AC | PRN

## 2015-11-04 MED ORDER — TECHNETIUM TC 99M MEBROFENIN IV KIT
5.5000 | PACK | Freq: Once | INTRAVENOUS | Status: AC | PRN
  Administered 2015-11-04: 5.5 via INTRAVENOUS

## 2015-11-04 NOTE — Discharge Summary (Addendum)
Physician Discharge Summary  Dana HackerLeigh Travis WUJ:[id number] DOB: 24-May-1996 DOA: 11/01/2015  PCP: Pcp Not In System  Admit date: 11/01/2015 Discharge date: 11/04/2015  Recommendations for Outpatient Follow-up:  1. Pt will need to follow up with PCP in 2 weeks post discharge  Discharge Diagnoses:  Acute asthma exacerbation -Presently stable on room air -Transition to oral prednisone 40 mg po x 2 more days which would complete 6 days of thereapy -Continue bronchodilators--home with rescue albuterol -Pulmonary hygiene -Chest x-ray negative for infiltrates Intractable vomiting and diarrhea -Suspect viral gastroenteritis--no vomiting on 11/02/2015 but vomited am 11/03/15 -Suspect the patient's clinical syndrome may have been incited by viral infection which may also account for her asthma exacerbation -?cyclic vomiting from THC? -start PPI--Omeprazole 40 mg daily -Certainly, the patient may have had gastric irritation from her recent viral type infection as well as from the prednisone -Urinalysis negative for pyuria -Beta hCG negative -Lipase 20 -placed on soft diet; but pt still having intermitten N/V -Abdominal x-ray negative for dilated loops of bowel -RUQ ultrasound--negative for cholelithiasis or gallbladder wall thickening; focal hepatic echogenicity in the left hepatic lobe--> MRI liver--negative for liver mass; ultrasound abnormality corresponds to focal fatty infiltration -Because the patient continued to have intermittent abdominal pain with associated nausea and vomiting, HIDA scan was obtained which revealed patent cystic duct, and normal EF; however the patient experienced pain with CCK injection-- -I offered a surgical opinion, but patient wants to go home and deferred surgical opinion to a later time -The patient was able to tolerate a soft diet without further emesis Transaminasemia -?viral infx -RUQ ultrasound--as above -HIV--negative -repeat LFTs in  am--normalized Hypokalemia -Repleted -Check magnesium--2.5 -Secondary to GI loss Cannabis use -cessation discussed  Discharge Condition: stable  Disposition: home  Diet:soft Wt Readings from Last 3 Encounters:  11/03/15 79.425 kg (175 lb 1.6 oz)  11/03/15 79.379 kg (175 lb)  10/29/15 79.918 kg (176 lb 3 oz)    History of present illness:  20 year old female with a history of asthma presented with 3-4 day history of shortness of breath and associated nausea and vomiting. The patient had ED visits on 10/29/2015 as well as 11/01/2015 after both of which the patient was sent home in stable condition. The patient returned on the evening of 11/01/2015 with worsening shortness of breath and vomiting. The patient stated she had a fever 101.72F on 10/28/2015 [facility] The patient was given empiric oseltamivir of which she only took 1 dose before she began having nausea and vomiting. She had not taken her temperature since [facility] in addition, the patient began having crampy abdominal pain and loose stools on the day of admission. After admission, the patient was treated for an asthma exacerbation. She was started on intravenous site amenable with good clinical improvement. She remained stable on room air. Unfortunately, she continued to have intermittent abdominal pain with nausea and vomiting. Right upper quadrant ultrasound showed cholelithiasis without cholecystitis. Follow-up HIDA scan showed normal EF with patent cystic duct, but the patient had pain with CCK injection. I offered surgical opinion for the patient given her current symptoms. However the patient wanted to go home and seek surgical opinion at a later date. The patient was able to tolerate soft diet without further emesis. She'll be sent home with Carafate at bedtime, PPI, and prednisone for 2 more days.  Discharge Exam: Filed Vitals:   11/03/15 2215 11/04/15 0530  BP: 126/68 125/84   Pulse:  53  Temp: 97.7 F (36.5 C) 98.5 F (36.9 C)  Resp: 16 16   Filed Vitals:   11/03/15 2153 11/03/15 2215 11/04/15 0530 11/04/15 1620  BP:  126/68 125/84   Pulse:   53   Temp:  97.7 F (36.5 C) 98.5 F (36.9 C)   TempSrc:  Oral Oral   Resp:  16 16   Height:      Weight:      SpO2: 98% 97% 98% 97%   General: A&O x 3, NAD, pleasant, cooperative Cardiovascular: RRR, no rub, no gallop, no S3 Respiratory: Bibasilar rales without wheezing. Abdomen:soft, nontender, nondistended, positive bowel sounds Extremities: No edema, No lymphangitis, no petechiae  Discharge Instructions     Medication List    STOP taking these medications        promethazine 25 MG tablet  Commonly known as:  PHENERGAN      TAKE these medications        albuterol 108 (90 Base) MCG/ACT inhaler  Commonly known as:  PROVENTIL HFA;VENTOLIN HFA  Inhale 2 puffs into the lungs every 6 (six) hours as needed for wheezing or shortness of breath.     amphetamine-dextroamphetamine 10 MG tablet  Commonly known as:  ADDERALL  Take 10 mg by mouth 2 (two) times daily as needed (for attention disorder).     LORazepam 0.5 MG tablet  Commonly known as:  ATIVAN  Take 0.5 mg by mouth at bedtime.     metoCLOPramide 10 MG tablet  Commonly known as:  REGLAN  Take 1 tablet (10 mg total) by mouth every 8 (eight) hours as needed for nausea or vomiting.     NEXPLANON Burns  Inject into the skin.     omeprazole 40 MG capsule  Commonly known as:  PRILOSEC  Take 1 capsule (40 mg total) by mouth daily.     ondansetron 8 MG disintegrating tablet  Commonly known as:  ZOFRAN ODT  Take 1 tablet (8 mg total) by mouth every 8 (eight) hours as needed for nausea or vomiting.     predniSONE 20 MG tablet  Commonly known as:  DELTASONE  Take 2 tablets (40 mg total) by mouth daily with breakfast. X 2 days  Start taking on:  11/05/2015     sucralfate 1 g tablet  Commonly known as:  CARAFATE  Take 1 tablet (1 g total) by  mouth at bedtime.     traZODone 50 MG tablet  Commonly known as:  DESYREL  Take 50 mg by mouth at bedtime.         The results of significant diagnostics from this hospitalization (including imaging, microbiology, ancillary and laboratory) are listed below for reference.    Significant Diagnostic Studies: Dg Chest 2 View  10/30/2015  CLINICAL DATA:  The tip fever and emesis.  Cough. EXAM: CHEST - 2 VIEW COMPARISON:  Two-view chest x-ray 11/12/2012. FINDINGS: The heart size and mediastinal contours are within normal limits. Both lungs are clear. The visualized skeletal structures are unremarkable. IMPRESSION: Negative two view chest x-ray Electronically Signed   By: Marin Roberts M.D.   On: 10/30/2015 14:45   Mr Liver W Wo Contrast  11/04/2015  CLINICAL DATA:  20 year old female presenting with 3-4 day history of shortness of breath with associated nausea and vomiting. Fever to 101.5 degrees. Crampy abdominal pain. Abnormal findings in the liver on recent ultrasound examination. EXAM: MRI ABDOMEN WITHOUT AND WITH CONTRAST TECHNIQUE: Multiplanar multisequence MR imaging of the abdomen was performed both before  and after the administration of intravenous contrast. CONTRAST:  16 mL of MultiHance. COMPARISON:  No priors. FINDINGS: Lower chest:  Unremarkable. 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 This area also demonstrates hypoperfusion on post gadolinium images indicative of a perfusion anomaly. No other suspicious cystic or solid hepatic lesions. No intra or extrahepatic biliary ductal dilatation. Gallbladder is normal in appearance. Pancreas: No pancreatic mass. No pancreatic ductal dilatation. No pancreatic or peripancreatic fluid or inflammatory changes. Spleen: Unremarkable.  Adrenals/Urinary Tract: Bilateral adrenal glands and bilateral kidneys are normal in appearance. No hydroureteronephrosis in the visualized abdomen. Stomach/Bowel: Visualized portions are unremarkable. Vascular/Lymphatic: No aneurysm identified in the abdominal vasculature. No lymphadenopathy noted in the abdomen. Other: No significant volume of ascites noted in the visualized peritoneal cavity. Musculoskeletal: No aggressive osseous lesions are noted in the visualized portions of the skeleton. IMPRESSION: 1. No acute findings in the abdomen to account for the patient's symptoms. 2. The perceived abnormality in liver corresponds to a perfusion anomaly and focal area of fatty infiltration in segment 4A and 4B adjacent to the falciform ligament, which is a commonly seen benign finding. Electronically Signed   By: Trudie Reed M.D.   On: 11/04/2015 07:56   Nm Hepato W/eject Fract  11/04/2015  CLINICAL DATA:  Right upper quadrant pain for 8 days EXAM: NUCLEAR MEDICINE HEPATOBILIARY IMAGING WITH GALLBLADDER EF TECHNIQUE: Sequential images of the abdomen were obtained out to 60 minutes following intravenous administration of radiopharmaceutical. After slow intravenous infusion of 1.6 micrograms Cholecystokinin, gallbladder ejection fraction was determined. RADIOPHARMACEUTICALS:  5.5 mCi Tc-55m Choletec IV COMPARISON:  None. FINDINGS: Gallbladder activity occurs after 6 minutes. Small bowel activity occurs after 66 minutes. Gallbladder ejection fraction is 100%. The patient did experience abdominal pain with injection of CCK. At 60 min, normal ejection fraction is greater than 40%. IMPRESSION: Cystic and common bile ducts are patent. Gallbladder ejection fraction is within normal limits. Electronically Signed   By: Jolaine Click M.D.   On: 11/04/2015 16:00   Dg Abd Acute W/chest  11/01/2015  CLINICAL DATA:  Intermittent diarrhea and vomiting over the past 5 days EXAM: DG ABDOMEN ACUTE W/ 1V CHEST COMPARISON:   10/30/2015 FINDINGS: There is no evidence of dilated bowel loops or free intraperitoneal air. No radiopaque calculi or other significant radiographic abnormality is seen. Heart size and mediastinal contours are within normal limits. Both lungs are clear. IMPRESSION: Negative abdominal radiographs.  No acute cardiopulmonary disease. Electronically Signed   By: Alcide Clever M.D.   On: 11/01/2015 12:03   US Abdomen Limited Ruq  11/03/2015  CLINICAL DATA:  Elevated transaminase levels. EXAM: US ABDOMEN LIMITED - RIGHT UPPER QUADRANT COMPARISON:  None. FINDINGS: Gallbladder: No gallstones or wall thickening visualized. No sonographic Murphy sign noted by sonographer. Common bile duct: Diameter: 2 mm Liver: Sonography identifies a 7.1 by 1.8 by 4.0 cm hyperechoic region in the left hepatic lobe. No other focal lesions identified. No intrahepatic biliary dilatation. IMPRESSION: 1. Focal hepatic hyperechogenicity in the left hepatic lobe, differential diagnostic considerations include focal steatosis or mass. In the context of the patient's abnormal transaminase levels, hepatic protocol MRI with and without contrast is recommended for further workup. Electronically Signed   By: Gaylyn Rong M.D.   On: 11/03/2015 08:25     Microbiology: No results  found for this or any previous visit (from the past 240 hour(s)).   Labs: Basic Metabolic Panel:  Recent Labs Lab 10/30/15 1350 11/01/15 0840 11/02/15 0350 11/03/15 0356 11/04/15 0342  NA 141 143 144 140 141  K 3.7 3.4* 3.5 3.8 4.3  CL 108 107 107 106 107  CO2 GLUCOSE 92 91 107* 103* 96  BUN CREATININE 0.83 0.69 0.75 0.83 0.81  CALCIUM 8.8* 9.5 8.6* 8.8* 9.0  MG  --   --   --  2.5*  --    Liver Function Tests:  Recent Labs Lab 10/29/15 1637 11/01/15 0840 11/02/15 0350 11/03/15 0356 11/04/15 0342  AST 31 35 55* 25 16  ALT 18 32 63* 48 37  ALKPHOS 57 49 45 42 44  BILITOT 0.5 1.1 0.8 0.5 0.5  PROT 8.3* 7.4  6.6 6.6 6.6  ALBUMIN 4.8 4.2 3.6 3.6 3.6    Recent Labs Lab 10/29/15 1637 11/01/15 0840  LIPASE 16 20   No results for input(s): AMMONIA in the last 168 hours. CBC:  Recent Labs Lab 10/29/15 1637 10/30/15 1350 11/01/15 0840 11/02/15 0350 11/03/15 0356  WBC 7.1 13.0* 9.1 15.6* 15.8*  NEUTROABS  --  10.6*  --   --   --   HGB 14.9 14.0 14.0 12.2 12.9  HCT 45.1 40.6 42.5 35.9* 40.2  MCV 87.2 85.3 89.1 85.5 90.7  PLT 372 342 205 314 339   Cardiac Enzymes: No results for input(s): CKTOTAL, CKMB, CKMBINDEX, TROPONINI in the last 168 hours. BNP: Invalid input(s): POCBNP CBG:  Recent Labs Lab 11/02/15 0633 11/03/15 0757 11/04/15 0826  GLUCAP 109* 75 77    Time coordinating discharge:  Greater than 30 minutes  Signed:  Krishon Adkison, DO [facility] Pager: [phone] 11/04/2015, 5:11 PM

## 2015-11-04 NOTE — Progress Notes (Signed)
Pt had an episode of vomitting of about 20 cc this a.m. Antiemetics and pain meds administeredwill continue to mointor.

## 2015-11-04 NOTE — Progress Notes (Signed)
Pt discharged home with mother in stable condition. Discharge instructions and scripts given. Pt and mother verbalized understanding.  

## 2016-10-11 IMAGING — CR DG CHEST 2V
2 series · 2 of 2 positions shown · non-contrast
Comparison: Two-view chest x-ray 11/12/2012.

CLINICAL DATA: The tip fever and emesis.  Cough.

EXAM:
CHEST - 2 VIEW

[w chest pa]
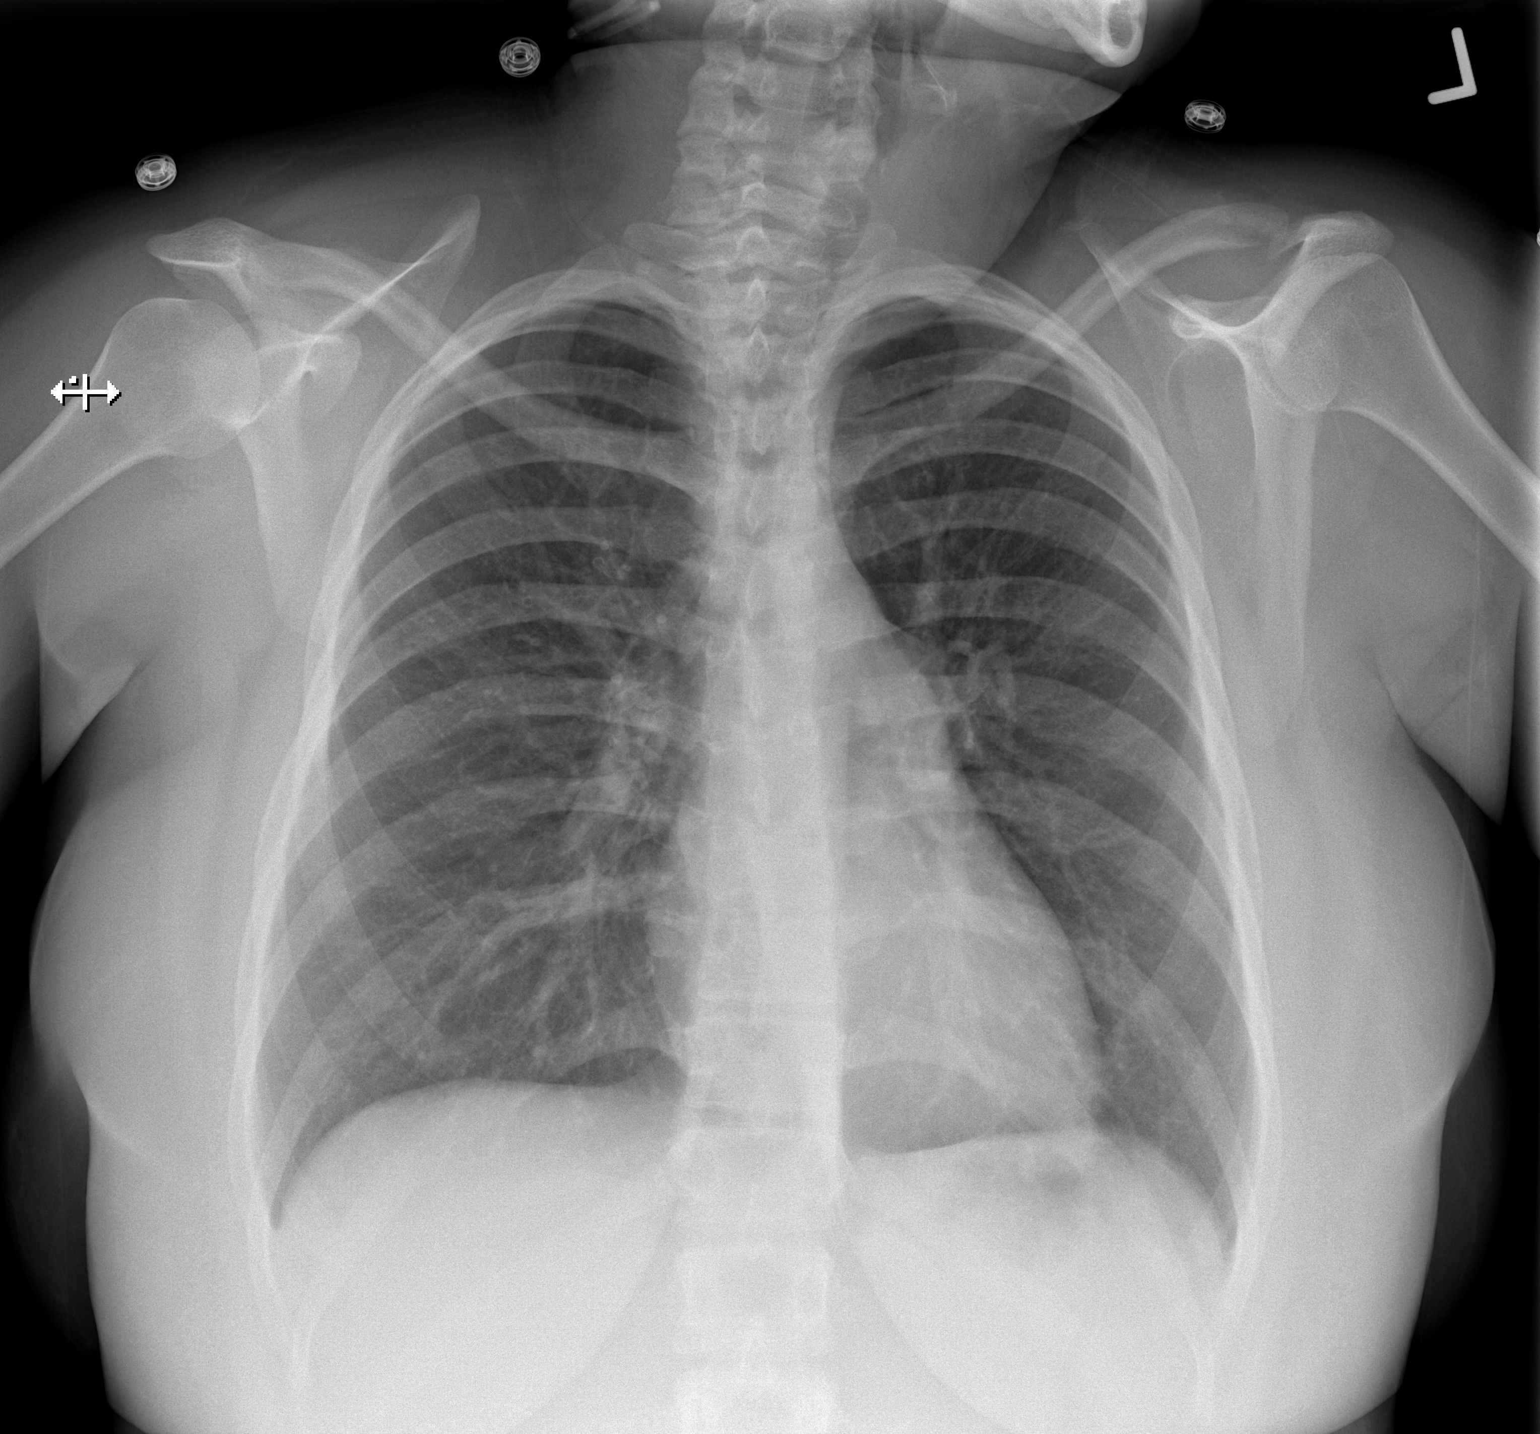

[w chest lat]
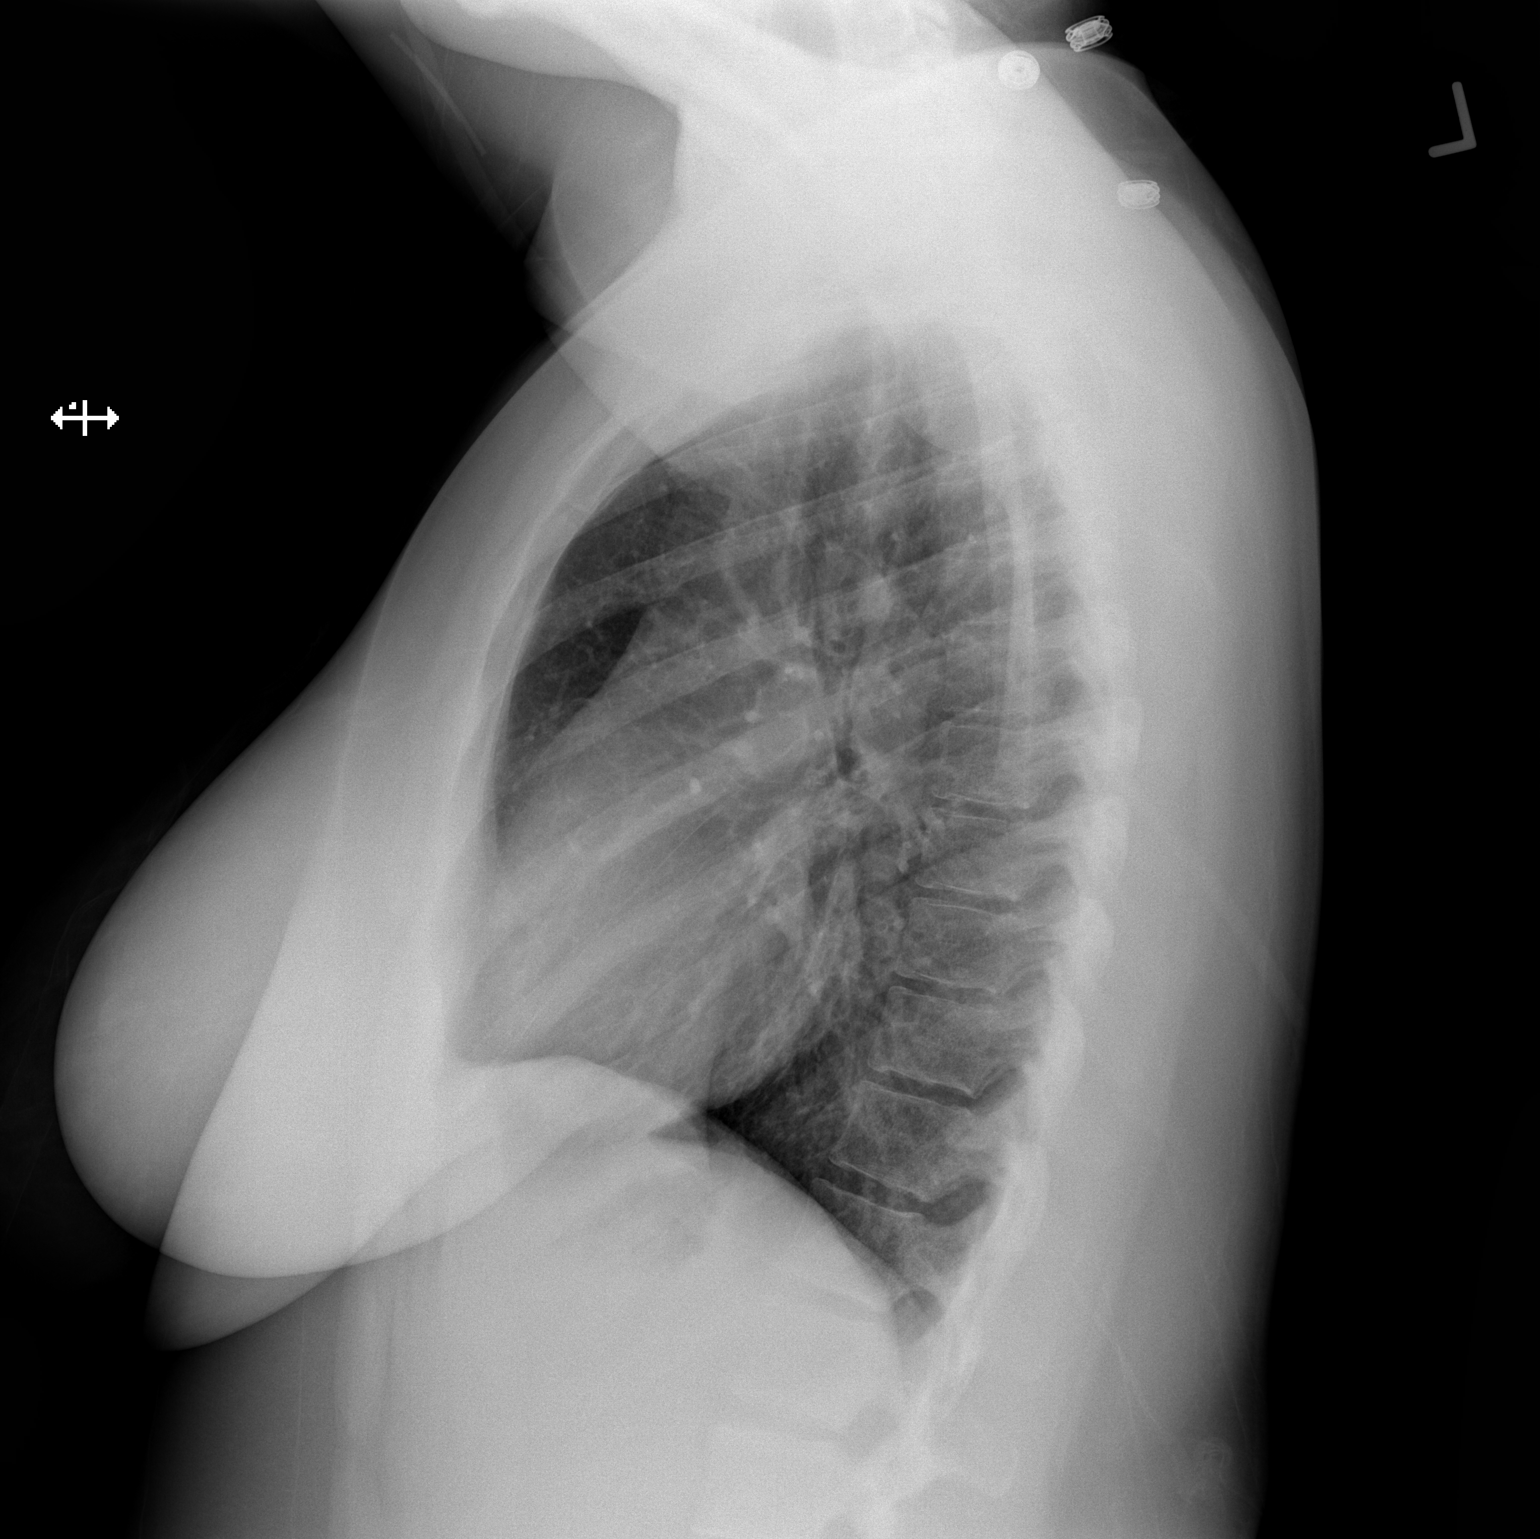

[2 of 2 positions shown; findings below may reference images not displayed]

FINDINGS: The heart size and mediastinal contours are within normal limits.
Both lungs are clear. The visualized skeletal structures are
unremarkable.
IMPRESSION: Negative two view chest x-ray

## 2016-10-16 IMAGING — NM NM HEPATO W/GB/PHARM/[PERSON_NAME]
2 series · 12 of 12 positions shown · non-contrast
Comparison: None.

CLINICAL DATA: Right upper quadrant pain for 8 days

EXAM:
NUCLEAR MEDICINE HEPATOBILIARY IMAGING WITH GALLBLADDER EF
TECHNIQUE: Sequential images of the abdomen were obtained [DATE] minutes
following intravenous administration of radiopharmaceutical. After
slow intravenous infusion of 1.6 micrograms Cholecystokinin,
gallbladder ejection fraction was determined.
RADIOPHARMACEUTICALS:  5.5 mCi Ic-PPm Choletec IV

[Series 1: biliary · 3.25mm/px · 6 of 57 frames shown]
[frame 5/57]
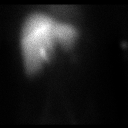
[frame 15/57]
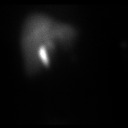
[frame 24/57]
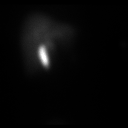
[frame 34/57]
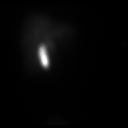
[frame 43/57]
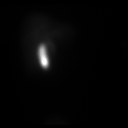
[frame 53/57]
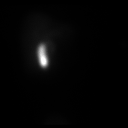

[Series 2: gbef · 3.25mm/px · 6 of 60 frames shown]
[frame 6/60]
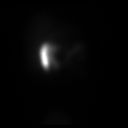
[frame 16/60]
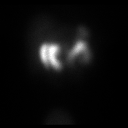
[frame 26/60]
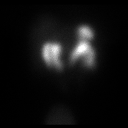
[frame 36/60]
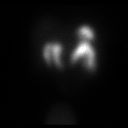
[frame 46/60]
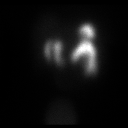
[frame 56/60]
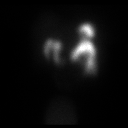

[12 of 12 positions shown; findings below may reference images not displayed]

FINDINGS: Gallbladder activity occurs after 6 minutes. Small bowel activity
occurs after 66 minutes. Gallbladder ejection fraction is 100%. The
patient did experience abdominal pain with injection of CCK. At 60
min, normal ejection fraction is greater than 40%.
IMPRESSION: Cystic and common bile ducts are patent. Gallbladder ejection
fraction is within normal limits.

## 2021-03-30 ENCOUNTER — Other Ambulatory Visit (HOSPITAL_COMMUNITY): Payer: Self-pay
